# Patient Record
Sex: Male | Born: 1955 | Race: White | Hispanic: No | Marital: Married | Smoking: Former smoker
Health system: Southern US, Community
[De-identification: ages and names within clinical notes are randomized; demographics above are authoritative.]

## PROBLEM LIST (undated history)

## (undated) DIAGNOSIS — J939 Pneumothorax, unspecified: Secondary | ICD-10-CM

## (undated) DIAGNOSIS — G8929 Other chronic pain: Secondary | ICD-10-CM

## (undated) DIAGNOSIS — S2249XA Multiple fractures of ribs, unspecified side, initial encounter for closed fracture: Secondary | ICD-10-CM

## (undated) DIAGNOSIS — F32A Depression, unspecified: Secondary | ICD-10-CM

## (undated) DIAGNOSIS — J449 Chronic obstructive pulmonary disease, unspecified: Secondary | ICD-10-CM

## (undated) DIAGNOSIS — F329 Major depressive disorder, single episode, unspecified: Secondary | ICD-10-CM

## (undated) DIAGNOSIS — M542 Cervicalgia: Secondary | ICD-10-CM

## (undated) HISTORY — DX: Chronic obstructive pulmonary disease, unspecified: J44.9

## (undated) HISTORY — DX: Other chronic pain: G89.29

## (undated) HISTORY — DX: Depression, unspecified: F32.A

## (undated) HISTORY — DX: Multiple fractures of ribs, unspecified side, initial encounter for closed fracture: S22.49XA

## (undated) HISTORY — DX: Major depressive disorder, single episode, unspecified: F32.9

## (undated) HISTORY — PX: KNEE SURGERY: SHX244

## (undated) HISTORY — DX: Cervicalgia: M54.2

---

## 1997-10-17 ENCOUNTER — Ambulatory Visit (HOSPITAL_COMMUNITY): Admission: RE | Admit: 1997-10-17 | Discharge: 1997-10-17 | Payer: Self-pay | Admitting: Orthopedic Surgery

## 1999-03-11 ENCOUNTER — Emergency Department (HOSPITAL_COMMUNITY): Admission: EM | Admit: 1999-03-11 | Discharge: 1999-03-11 | Payer: Self-pay | Admitting: Emergency Medicine

## 2003-03-01 ENCOUNTER — Encounter: Payer: Self-pay | Admitting: Family Medicine

## 2003-03-01 ENCOUNTER — Encounter: Admission: RE | Admit: 2003-03-01 | Discharge: 2003-03-01 | Payer: Self-pay | Admitting: Family Medicine

## 2003-03-14 ENCOUNTER — Ambulatory Visit (HOSPITAL_COMMUNITY): Admission: RE | Admit: 2003-03-14 | Discharge: 2003-03-14 | Payer: Self-pay | Admitting: Gastroenterology

## 2003-03-14 LAB — HM COLONOSCOPY: HM Colonoscopy: NORMAL

## 2003-04-24 ENCOUNTER — Encounter: Admission: RE | Admit: 2003-04-24 | Discharge: 2003-04-24 | Payer: Self-pay | Admitting: Family Medicine

## 2004-03-13 ENCOUNTER — Encounter: Payer: Self-pay | Admitting: Internal Medicine

## 2004-06-19 ENCOUNTER — Emergency Department (HOSPITAL_COMMUNITY): Admission: EM | Admit: 2004-06-19 | Discharge: 2004-06-20 | Payer: Self-pay | Admitting: Emergency Medicine

## 2007-03-31 ENCOUNTER — Ambulatory Visit: Payer: Self-pay | Admitting: Internal Medicine

## 2007-03-31 DIAGNOSIS — M5030 Other cervical disc degeneration, unspecified cervical region: Secondary | ICD-10-CM

## 2007-03-31 DIAGNOSIS — Z9889 Other specified postprocedural states: Secondary | ICD-10-CM

## 2007-03-31 DIAGNOSIS — F17200 Nicotine dependence, unspecified, uncomplicated: Secondary | ICD-10-CM | POA: Insufficient documentation

## 2007-03-31 DIAGNOSIS — F329 Major depressive disorder, single episode, unspecified: Secondary | ICD-10-CM

## 2007-06-29 HISTORY — PX: HERNIA REPAIR: SHX51

## 2007-08-23 ENCOUNTER — Ambulatory Visit: Payer: Self-pay | Admitting: Internal Medicine

## 2007-08-23 DIAGNOSIS — N508 Other specified disorders of male genital organs: Secondary | ICD-10-CM | POA: Insufficient documentation

## 2007-08-31 ENCOUNTER — Ambulatory Visit: Payer: Self-pay | Admitting: Internal Medicine

## 2007-09-07 ENCOUNTER — Encounter (INDEPENDENT_AMBULATORY_CARE_PROVIDER_SITE_OTHER): Payer: Self-pay | Admitting: *Deleted

## 2007-09-29 ENCOUNTER — Encounter: Payer: Self-pay | Admitting: Internal Medicine

## 2007-10-10 ENCOUNTER — Encounter: Payer: Self-pay | Admitting: Internal Medicine

## 2007-10-10 ENCOUNTER — Ambulatory Visit (HOSPITAL_BASED_OUTPATIENT_CLINIC_OR_DEPARTMENT_OTHER): Admission: RE | Admit: 2007-10-10 | Discharge: 2007-10-10 | Payer: Self-pay | Admitting: Surgery

## 2007-10-31 ENCOUNTER — Encounter: Payer: Self-pay | Admitting: Internal Medicine

## 2008-01-08 ENCOUNTER — Telehealth (INDEPENDENT_AMBULATORY_CARE_PROVIDER_SITE_OTHER): Payer: Self-pay | Admitting: *Deleted

## 2008-02-01 ENCOUNTER — Telehealth (INDEPENDENT_AMBULATORY_CARE_PROVIDER_SITE_OTHER): Payer: Self-pay | Admitting: *Deleted

## 2008-02-09 ENCOUNTER — Telehealth (INDEPENDENT_AMBULATORY_CARE_PROVIDER_SITE_OTHER): Payer: Self-pay | Admitting: *Deleted

## 2008-03-26 ENCOUNTER — Ambulatory Visit: Payer: Self-pay | Admitting: Internal Medicine

## 2008-05-20 ENCOUNTER — Telehealth (INDEPENDENT_AMBULATORY_CARE_PROVIDER_SITE_OTHER): Payer: Self-pay | Admitting: *Deleted

## 2009-01-15 ENCOUNTER — Telehealth (INDEPENDENT_AMBULATORY_CARE_PROVIDER_SITE_OTHER): Payer: Self-pay | Admitting: *Deleted

## 2009-04-14 ENCOUNTER — Encounter (INDEPENDENT_AMBULATORY_CARE_PROVIDER_SITE_OTHER): Payer: Self-pay | Admitting: *Deleted

## 2009-04-14 ENCOUNTER — Telehealth (INDEPENDENT_AMBULATORY_CARE_PROVIDER_SITE_OTHER): Payer: Self-pay | Admitting: *Deleted

## 2009-09-01 ENCOUNTER — Telehealth (INDEPENDENT_AMBULATORY_CARE_PROVIDER_SITE_OTHER): Payer: Self-pay | Admitting: *Deleted

## 2009-10-01 ENCOUNTER — Ambulatory Visit: Payer: Self-pay | Admitting: Internal Medicine

## 2009-10-03 ENCOUNTER — Telehealth (INDEPENDENT_AMBULATORY_CARE_PROVIDER_SITE_OTHER): Payer: Self-pay | Admitting: *Deleted

## 2009-10-03 ENCOUNTER — Ambulatory Visit: Payer: Self-pay | Admitting: Internal Medicine

## 2009-10-08 LAB — CONVERTED CEMR LAB
ALT: 18 units/L (ref 0–53)
AST: 17 units/L (ref 0–37)
BUN: 11 mg/dL (ref 6–23)
Basophils Absolute: 0 10*3/uL (ref 0.0–0.1)
Basophils Relative: 0.3 % (ref 0.0–3.0)
CO2: 31 meq/L (ref 19–32)
Calcium: 9 mg/dL (ref 8.4–10.5)
Chloride: 105 meq/L (ref 96–112)
Cholesterol: 176 mg/dL (ref 0–200)
Creatinine, Ser: 0.8 mg/dL (ref 0.4–1.5)
Eosinophils Absolute: 0.1 10*3/uL (ref 0.0–0.7)
Eosinophils Relative: 0.8 % (ref 0.0–5.0)
GFR calc non Af Amer: 106.41 mL/min (ref >60)
Glucose, Bld: 92 mg/dL (ref 70–99)
HCT: 43.3 % (ref 39.0–52.0)
HDL: 58.3 mg/dL (ref >39.00)
Hemoglobin: 14.9 g/dL (ref 13.0–17.0)
LDL Cholesterol: 103 mg/dL — ABNORMAL HIGH (ref 0–99)
Lymphocytes Relative: 21.9 % (ref 12.0–46.0)
Lymphs Abs: 2 10*3/uL (ref 0.7–4.0)
MCHC: 34.4 g/dL (ref 30.0–36.0)
MCV: 90.7 fL (ref 78.0–100.0)
Monocytes Absolute: 0.6 10*3/uL (ref 0.1–1.0)
Monocytes Relative: 6.4 % (ref 3.0–12.0)
Neutro Abs: 6.4 10*3/uL (ref 1.4–7.7)
Neutrophils Relative %: 70.6 % (ref 43.0–77.0)
PSA: 0.97 ng/mL (ref 0.10–4.00)
Platelets: 214 10*3/uL (ref 150.0–400.0)
Potassium: 4.1 meq/L (ref 3.5–5.1)
RBC: 4.77 M/uL (ref 4.22–5.81)
RDW: 13.4 % (ref 11.5–14.6)
Sodium: 141 meq/L (ref 135–145)
TSH: 1.19 microintl units/mL (ref 0.35–5.50)
Total CHOL/HDL Ratio: 3
Triglycerides: 72 mg/dL (ref 0.0–149.0)
VLDL: 14.4 mg/dL (ref 0.0–40.0)
WBC: 9.1 10*3/uL (ref 4.5–10.5)

## 2010-04-08 ENCOUNTER — Encounter: Payer: Self-pay | Admitting: Internal Medicine

## 2010-04-08 LAB — HM COLONOSCOPY

## 2010-05-14 ENCOUNTER — Ambulatory Visit: Payer: Self-pay | Admitting: Family Medicine

## 2010-06-17 ENCOUNTER — Ambulatory Visit: Payer: Self-pay | Admitting: Family Medicine

## 2010-07-26 LAB — CONVERTED CEMR LAB
ALT: 19 units/L (ref 0–53)
AST: 19 units/L (ref 0–37)
BUN: 12 mg/dL (ref 6–23)
Basophils Absolute: 0 10*3/uL (ref 0.0–0.1)
Basophils Relative: 0.4 % (ref 0.0–1.0)
Bilirubin Urine: NEGATIVE
Blood in Urine, dipstick: NEGATIVE
CO2: 32 meq/L (ref 19–32)
Calcium: 9.5 mg/dL (ref 8.4–10.5)
Chloride: 104 meq/L (ref 96–112)
Cholesterol: 171 mg/dL (ref 0–200)
Creatinine, Ser: 0.9 mg/dL (ref 0.4–1.5)
Eosinophils Absolute: 0.1 10*3/uL (ref 0.0–0.6)
Eosinophils Relative: 0.9 % (ref 0.0–5.0)
GFR calc Af Amer: 114 mL/min
GFR calc non Af Amer: 94 mL/min
Glucose, Bld: 87 mg/dL (ref 70–99)
Glucose, Urine, Semiquant: NEGATIVE
HCT: 45 % (ref 39.0–52.0)
HDL: 58.4 mg/dL (ref >39.0)
Hemoglobin: 15.2 g/dL (ref 13.0–17.0)
Ketones, urine, test strip: NEGATIVE
LDL Cholesterol: 96 mg/dL (ref 0–99)
Lymphocytes Relative: 21.7 % (ref 12.0–46.0)
MCHC: 33.8 g/dL (ref 30.0–36.0)
MCV: 89.3 fL (ref 78.0–100.0)
Monocytes Absolute: 0.6 10*3/uL (ref 0.2–0.7)
Monocytes Relative: 6.8 % (ref 3.0–11.0)
Neutro Abs: 5.7 10*3/uL (ref 1.4–7.7)
Neutrophils Relative %: 70.2 % (ref 43.0–77.0)
Nitrite: NEGATIVE
PSA: 1.99 ng/mL (ref 0.10–4.00)
Platelets: 246 10*3/uL (ref 150–400)
Potassium: 4.3 meq/L (ref 3.5–5.1)
Protein, U semiquant: NEGATIVE
RBC: 5.04 M/uL (ref 4.22–5.81)
RDW: 12.2 % (ref 11.5–14.6)
Sodium: 141 meq/L (ref 135–145)
Specific Gravity, Urine: 1.01
TSH: 1.44 microintl units/mL (ref 0.35–5.50)
Total CHOL/HDL Ratio: 2.9
Triglycerides: 82 mg/dL (ref 0–149)
Urobilinogen, UA: NEGATIVE
VLDL: 16 mg/dL (ref 0–40)
WBC Urine, dipstick: NEGATIVE
WBC: 8.2 10*3/uL (ref 4.5–10.5)
pH: 7

## 2010-07-28 NOTE — Assessment & Plan Note (Signed)
Summary: cough.congested/cbs   Vital Signs:  Patient profile:   55 year old male Weight:      190 pounds BMI:     27.76 Temp:     98.2 degrees F oral Pulse rate:   70 / minute BP sitting:   114 / 60  (left arm)  Vitals Entered By: Doristine Devoid CMA (May 14, 2010 1:30 PM) CC: cough and congestion x1wk along w/ sinus drainage, Cough   History of Present Illness:  Cough      This is a 55 year old man who presents with Cough.  The symptoms began 1 week ago.  Pt here c/o cough for 5 days ---- everyone in house is sick.  The patient reports productive cough, shortness of breath, and wheezing, but denies non-productive cough, pleuritic chest pain, exertional dyspnea, fever, hemoptysis, and malaise.  The patient denies the following symptoms: cold/URI symptoms, sore throat, nasal congestion, chronic rhinitis, weight loss, acid reflux symptoms, and peripheral edema.  The cough is worse with activity and lying down.    Current Medications (verified): 1)  Effexor Xr 37.5 Mg  Cp24 (Venlafaxine Hcl) .Marland Kitchen.. 1 By Mouth Qd 2)  Augmentin 875-125 Mg Tabs (Amoxicillin-Pot Clavulanate) .Marland Kitchen.. 1 By Mouth Two Times A Day 3)  Mucinex Dm 30-600 Mg Xr12h-Tab (Dextromethorphan-Guaifenesin) 4)  Cheratussin Ac 100-10 Mg/9ml Syrp (Guaifenesin-Codeine) .Marland Kitchen.. 1-2 Tsp By Mouth At Bedtime  Allergies (verified): No Known Drug Allergies  Past History:  Past Medical History: Last updated: 03/31/2007 Depression  Past Surgical History: Last updated: 10-02-09 R knee arthroscopy R hernia repair 2009  Family History: Last updated: 2009/10/02 COPD-- Father ( deceased ) DM-- F Mother: living; skin Ca breast CA-- M, dx age 83 CAD: MGF? colon ca--no prostate ca--no  Social History: Last updated: 2009/10/02 Married 4 kids still smoking 1 ppd or more  ETOH-- rarely  diet-- healthy on-off  exercise-- always active , occasionally goes to the "[facility]", not running anymore   Risk Factors: Caffeine Use: 4  (October 02, 2009) Exercise: no (2009/10/02)  Risk Factors: Smoking Status: current (03/31/2007) Packs/Day: 1 (2009-10-02) Passive Smoke Exposure: no (03/31/2007)  Family History: Reviewed history from Oct 02, 2009 and no changes required. COPD-- Father ( deceased ) DM-- F Mother: living; skin Ca breast CA-- M, dx age 54 CAD: MGF? colon ca--no prostate ca--no  Social History: Reviewed history from 02-Oct-2009 and no changes required. Married 4 kids still smoking 1 ppd or more  ETOH-- rarely  diet-- healthy on-off  exercise-- always active , occasionally goes to the "[facility]", not running anymore   Review of Systems      See HPI  Physical Exam  General:  Well-developed,well-nourished,in no acute distress; alert,appropriate and cooperative throughout examination Ears:  External ear exam shows no significant lesions or deformities.  Otoscopic examination reveals clear canals, tympanic membranes are intact bilaterally without bulging, retraction, inflammation or discharge. Hearing is grossly normal bilaterally. Nose:  External nasal examination shows no deformity or inflammation. Nasal mucosa are pink and moist without lesions or exudates. Mouth:  Oral mucosa and oropharynx without lesions or exudates.  Teeth in good repair. Neck:  No deformities, masses, or tenderness noted. Lungs:  + rhonci scattered Heart:  normal rate and no murmur.   Psych:  Cognition and judgment appear intact. Alert and cooperative with normal attention span and concentration. No apparent delusions, illusions, hallucinations   Impression & Recommendations:  Problem # 1:  BRONCHITIS- ACUTE (ICD-466.0)  His updated medication list for this problem includes:    Augmentin  875-125 Mg Tabs (Amoxicillin-pot clavulanate) .Marland Kitchen... 1 by mouth two times a day    Mucinex Dm 30-600 Mg Xr12h-tab (Dextromethorphan-guaifenesin)    Cheratussin Ac 100-10 Mg/31ml Syrp (Guaifenesin-codeine) .Marland Kitchen... 1-2 tsp by mouth at bedtime  Take  antibiotics and other medications as directed. Encouraged to push clear liquids, get enough rest, and take acetaminophen as needed. To be seen in 5-7 days if no improvement, sooner if worse.  Complete Medication List: 1)  Effexor Xr 37.5 Mg Cp24 (Venlafaxine hcl) .Marland Kitchen.. 1 by mouth qd 2)  Augmentin 875-125 Mg Tabs (Amoxicillin-pot clavulanate) .Marland Kitchen.. 1 by mouth two times a day 3)  Mucinex Dm 30-600 Mg Xr12h-tab (Dextromethorphan-guaifenesin) 4)  Cheratussin Ac 100-10 Mg/79ml Syrp (Guaifenesin-codeine) .Marland Kitchen.. 1-2 tsp by mouth at bedtime  Patient Instructions: 1)  Acute Bronchitis symptoms for less then 10 days are not  helped by antibiotics. Take over the counter cough medications. Call if no improvement in 5-7 days, sooner if increasing cough, fever, or new symptoms ( shortness of breath, chest pain) .  Prescriptions: CHERATUSSIN AC 100-10 MG/5ML SYRP (GUAIFENESIN-CODEINE) 1-2 tsp by mouth at bedtime  #6oz x 0   Entered and Authorized by:   Loreen Freud DO   Signed by:   Loreen Freud DO on 05/14/2010   Method used:   Print then Give to Patient   RxID:   [id number] AUGMENTIN 875-125 MG TABS (AMOXICILLIN-POT CLAVULANATE) 1 by mouth two times a day  #20 x 0   Entered and Authorized by:   Loreen Freud DO   Signed by:   Loreen Freud DO on 05/14/2010   Method used:   Electronically to        [facility] [phone]* (retail)       [address]       [place], [place]  [address]       Ph: [phone]       Fax: [phone]   RxID:   [id number]    Orders Added: 1)  Est. Patient Level III [47425]

## 2010-07-28 NOTE — Assessment & Plan Note (Signed)
Summary: cpx/alr   Vital Signs:  Patient profile:   55 year old male Height:      69.5 inches Weight:      189 pounds BMI:     27.61 Pulse rate:   60 / minute BP sitting:   110 / 70  Vitals Entered By: Shary Decamp (October 01, 2009 3:12 PM) CC: cpx - not fasting Is Patient Diabetic? No   History of Present Illness: CPX  Preventive Screening-Counseling & Management  Alcohol-Tobacco     Alcohol type: beer, wine occasionally     Packs/Day: 1  Caffeine-Diet-Exercise     Caffeine use/day: 4     Does Patient Exercise: no     Times/week: 2  Current Medications (verified): 1)  Effexor Xr 37.5 Mg  Cp24 (Venlafaxine Hcl) .Marland Kitchen.. 1 By Mouth Qd  Allergies (verified): No Known Drug Allergies  Past History:  Past Medical History: Reviewed history from 03/31/2007 and no changes required. Depression  Past Surgical History: R knee arthroscopy R hernia repair 2009  Family History: COPD-- Father ( deceased ) DM-- F Mother: living; skin Ca breast CA-- M, dx age 16 CAD: MGF? colon ca--no prostate ca--no  Social History: Married 4 kids still smoking 1 ppd or more  ETOH-- rarely  diet-- healthy on-off  exercise-- always active , occasionally goes to the "[facility]", not running anymore  Packs/Day:  1 Does Patient Exercise:  no Caffeine use/day:  4  Review of Systems General:  Denies fatigue, fever, and weight loss. CV:  Denies chest pain or discomfort and swelling of feet. Resp:  Denies coughing up blood and wheezing; occasionally cough in AM w/some sputum . GI:  Denies bloody stools, diarrhea, nausea, and vomiting. GU:  Denies dysuria, hematuria, urinary frequency, and urinary hesitancy. MS:  occasionally has neck pain (x a while, will call if symptoms change or get worse).  Physical Exam  General:  alert and well-developed.   Neck:  no masses, no thyromegaly, normal carotid upstroke, and no cervical-supraclacicular  lymphadenopathy.   Lungs:  normal respiratory effort,  no intercostal retractions, no accessory muscle use, and normal breath sounds.   Heart:  normal rate, regular rhythm, no murmur, and no gallop.   Abdomen:  soft, non-tender, no distention, no masses, no guarding, and no rigidity.   Rectal:  No external abnormalities noted. Normal sphincter tone. No rectal masses or tenderness. hemocult neg  Prostate:  Prostate gland firm and smooth, no nodularity, tenderness, mass, asymmetry or induration. slightly  enlarged  Extremities:  no pretibial edema bilaterally  Psych:  Cognition and judgment appear intact. Alert and cooperative with normal attention span and concentration.     Impression & Recommendations:  Problem # 1:  HEALTH SCREENING (ICD-V70.0)  Td 6 had a Cscope in 2004 for BRBPR,  they found hemorrhoids and tics,  was recommended to repeat a colonoscopy at age 69 (Dr Ewing Schlein)--  refer to Dr Ewing Schlein again discussed diet-exercise labs   Orders: Gastroenterology Referral (GI)  Problem # 2:  DEPRESSION (ICD-311) well controlled  His updated medication list for this problem includes:    Effexor Xr 37.5 Mg Cp24 (Venlafaxine hcl) .Marland Kitchen... 1 by mouth qd  Problem # 3:  TOBACCO ABUSE (ICD-305.1) risks of tobacco discussed, rec to see dentist q 6 months, infro provided about quiting as well as [facility] programs     Complete Medication List: 1)  Effexor Xr 37.5 Mg Cp24 (Venlafaxine hcl) .Marland Kitchen.. 1 by mouth qd  Patient Instructions: 1)  came  back fasting: 2)  FLP AST ALT CBC BMP TSH PSA --- V70 3)  Please schedule a follow-up appointment in 1 year.    Risk Factors:  Tobacco use:  current    Cigarettes:  Yes -- 1 pack(s) per day Passive smoke exposure:  no Drug use:  no Caffeine use:  4 drinks per day Alcohol use:  yes    Type:  beer, wine occasionally Exercise:  no  Colonoscopy History:     Date of Last Colonoscopy:  03/14/2003    Results:  normal     Preventive Care Screening  Colonoscopy:    Date:  03/14/2003    Results:  normal     Prior Values:    PSA:  1.99 (08/31/2007)    Last Tetanus Booster:  Tdap (03/31/2007)

## 2010-07-28 NOTE — Procedures (Signed)
Summary: Colonoscopy--tics, 1 polyp, next  in 10 years  Colonoscopy / [facility]   Imported By: Lennie Odor 04/24/2010 12:12:28  _____________________________________________________________________  External Attachment:    Type:   Image     Comment:   External Document

## 2010-07-28 NOTE — Progress Notes (Signed)
Summary: refill  Phone Note Refill Request Message from:  Fax from Pharmacy on September 01, 2009 9:33 AM  Refills Requested: Medication #1:  EFFEXOR XR 37.5 MG  CP24 1 by mouth qd [facility] fax [phone]   Method Requested: Mail to Pharmacy Next Appointment Scheduled: no appt Initial call taken by: Barb Merino,  September 01, 2009 9:34 AM  Follow-up for Phone Call        spoke wth pt informed due for ov. Scheduled CPX for 10/01/09.  refilled for 30 days with no refills .Kandice Hams  September 01, 2009 10:01 AM  Follow-up by: Kandice Hams,  September 01, 2009 10:01 AM    Prescriptions: EFFEXOR XR 37.5 MG  CP24 (VENLAFAXINE HCL) 1 by mouth qd  #30 x 0   Entered by:   Kandice Hams   Authorized by:   Nolon Rod. Paz MD   Signed by:   Kandice Hams on 09/01/2009   Method used:   Faxed to ...       [facility]  [address] [phone]* (retail)       [address]       [place], [place]  [address]       Ph: [phone]       Fax: [phone]   RxID:   [id number]

## 2010-07-28 NOTE — Progress Notes (Signed)
Summary: NEEDS 90 PRESCRIPTION FOR EFFEXOR  Phone Note Call from Patient Call back at [phone] = CELL   Caller: Patient Summary of Call: PATIENT NEEDS 90 PRESCRIPTION PLUS REFILLS FOR EFFEXOR SENT TO [facility]--HE ALREADY HAS AN ACCOUNT WITH THEM Initial call taken by: Jerolyn Shin,  October 03, 2009 12:15 PM    Prescriptions: EFFEXOR XR 37.5 MG  CP24 (VENLAFAXINE HCL) 1 by mouth qd  #90 x 3   Entered by:   Shary Decamp   Authorized by:   Nolon Rod. Paz MD   Signed by:   Shary Decamp on 10/03/2009   Method used:   Faxed to ...       [facility] (mail-order)             , [place]         Ph:        Fax: [phone]   RxID:   [id number]

## 2010-07-28 NOTE — Miscellaneous (Signed)
Summary: colonoscopy   NAME:  Curtis [name], Curtis [name]                         ACCOUNT NO.:  [id number]   MEDICAL RECORD NO.:  [id number]                   PATIENT TYPE:  AMB   LOCATION:  ENDO                                 FACILITY:  [facility]   PHYSICIAN:  Petra Kuba, M.D.                 DATE OF BIRTH:  1955-10-31   DATE OF PROCEDURE:  03/14/2003  DATE OF DISCHARGE:                                 OPERATIVE REPORT   PROCEDURE:  Colonoscopy.   INDICATIONS FOR PROCEDURE:  Bright red blood per rectum in a patient almost  due for colonic screening.  Consent was signed after risks, benefits,  methods, and options were thoroughly discussed in the office.   MEDICINES USED:  Demerol 50, Versed 6.   DESCRIPTION OF PROCEDURE:  Rectal inspection was pertinent for small  external hemorrhoids. Digital exam was negative. The pediatric video  adjustable colonoscope was inserted, easily advanced around the colon to the  cecum. This did require some abdominal pressure no position changes. No  signs of bleeding was seen on insertion. The cecum was identified by the  appendiceal orifice and the ileocecal valve. In fact, the scope was inserted  a short ways into the terminal ileum which was normal. Photo documentation  was obtained. The scope was slowly withdrawn. The prep was adequate. There  was some liquid stool that required washing and suctioning. On slow  withdrawal through the colon other than some early occasional left sided  diverticula, no abnormalities were seen. Anal rectal pull through and  retroflexion confirmed some small hemorrhoids. The scope was straightened  and readvanced a short ways up the left side of the colon, air was  suctioned, scope removed. The patient tolerated the procedure well. There  was no obvious or immediate complications.   ENDOSCOPIC DIAGNOSIS:  1. Internal and external small hemorrhoids.  2. Left occasional early diverticula.  3. Otherwise within normal limits to  the terminal ileum.   PLAN:  Yearly rectals and guaiacs per Dr. Modesto Charon. Happy to see back p.r.n.  otherwise repeat screening at age 56.                                               Petra Kuba, M.D.    MEM/MEDQ  D:  03/14/2003  T:  03/15/2003  Job:  [id number]   cc:   Thelma Barge P. Modesto Charon, M.D.  [address]  [place]  [place] [address]  Fax: [phone]  Clinical Lists Changes

## 2010-07-28 NOTE — Progress Notes (Signed)
Summary: RX  Phone Note Refill Request Message from:  Pharmacy on [facility] [phone]  Refills Requested: Medication #1:  EFFEXOR XR 37.5 MG  CP24 1 by mouth qd Initial call taken by: Kandice Hams,  April 14, 2009 4:49 PM    Prescriptions: EFFEXOR XR 37.5 MG  CP24 (VENLAFAXINE HCL) 1 by mouth qd  #90 x 0   Entered by:   Kandice Hams   Authorized by:   Nolon Rod. Paz MD   Signed by:   Kandice Hams on 04/14/2009   Method used:   Faxed to ...       [facility] (mail-order)             , [place]         Ph:        Fax: [phone]   RxID:   [id number]

## 2010-07-30 NOTE — Assessment & Plan Note (Signed)
Summary: FOR COUGH//PH   Vital Signs:  Patient profile:   55 year old male Weight:      191 pounds BMI:     27.90 Temp:     98.3 degrees F oral BP sitting:   112 / 64  (left arm)  Vitals Entered By: Doristine Devoid CMA (June 17, 2010 11:44 AM) CC: cough and sinus congestion along w/ L ear pain    History of Present Illness: 55 yo Curtis [name] here today for cough and congestion.  woke up w/ L ear pain.  took Augmentin last month for something similar.  still w/ deep cough, nasal congestion, wheezing.  no fever.  no facial pain/pressure.  + sick contacts.  Current Medications (verified): 1)  Effexor Xr 37.5 Mg  Cp24 (Venlafaxine Hcl) .Marland Kitchen.. 1 By Mouth Qd 2)  Mucinex Dm 30-600 Mg Xr12h-Tab (Dextromethorphan-Guaifenesin)  Allergies (verified): No Known Drug Allergies  Review of Systems      See HPI  Physical Exam  General:  Well-developed,well-nourished,in no acute distress; alert,appropriate and cooperative throughout examination Head:  NCAT, no TTP over sinuses Eyes:  no injxn or inflammation Ears:  External ear exam shows no significant lesions or deformities.  Otoscopic examination reveals clear canals, tympanic membranes are intact bilaterally without bulging, retraction, inflammation or discharge. Hearing is grossly normal bilaterally. Nose:  + turbinate edema and congestion Mouth:  + PND otherwise normal Neck:  No deformities, masses, or tenderness noted. Lungs:  Normal respiratory effort, chest expands symmetrically. Lungs are clear to auscultation, no crackles or wheezes.  deep cough Heart:  normal rate and no murmur.     Impression & Recommendations:  Problem # 1:  BRONCHITIS- ACUTE (ICD-466.0) Assessment New given duration of cough will start abx.  if no improvement after this round of tx will need CXR.  likely an allergy component to cough and pt to start nasal steroid spray.  reviewed supportive care and red flags that should prompt return.  Pt expresses understanding  and is in agreement w/ this plan. The following medications were removed from the medication list:    Cheratussin Ac 100-10 Mg/64ml Syrp (Guaifenesin-codeine) .Marland Kitchen... 1-2 tsp by mouth at bedtime His updated medication list for this problem includes:    Mucinex Dm 30-600 Mg Xr12h-tab (Dextromethorphan-guaifenesin)    Azithromycin 250 Mg Tabs (Azithromycin) .Marland Kitchen... 2 by  mouth today and then 1 daily for 4 days    Cheratussin Ac 100-10 Mg/36ml Syrp (Guaifenesin-codeine) .Marland Kitchen... 1-2 tsps q4-6 as needed for cough.  disp  Complete Medication List: 1)  Effexor Xr 37.5 Mg Cp24 (Venlafaxine hcl) .Marland Kitchen.. 1 by mouth qd 2)  Mucinex Dm 30-600 Mg Xr12h-tab (Dextromethorphan-guaifenesin) 3)  Azithromycin 250 Mg Tabs (Azithromycin) .... 2 by  mouth today and then 1 daily for 4 days 4)  Fluticasone Propionate 50 Mcg/act Susp (Fluticasone propionate) .... 2 sprays each nostril once daily 5)  Cheratussin Ac 100-10 Mg/24ml Syrp (Guaifenesin-codeine) .Marland Kitchen.. 1-2 tsps q4-6 as needed for cough.  disp  Patient Instructions: 1)  Start the nasal spray daily to decrease nasal congestion 2)  Take the Azithromycin as directed for bronchitis- if the cough continues after this round of antibiotics we'll need to get a chest xray 3)  Drink plenty of fluids 4)  REST! 5)  Use the cough medicine at night and on weekends- will cause drowsiness 6)  Call with any questions or concerns 7)  Hang in there! 8)  Happy Holidays!! Prescriptions: CHERATUSSIN AC 100-10 MG/5ML SYRP (GUAIFENESIN-CODEINE) 1-2 tsps  Q4-6 as needed for cough.  disp  #163ml x 0   Entered and Authorized by:   Neena Rhymes MD   Signed by:   Neena Rhymes MD on 06/17/2010   Method used:   Print then Give to Patient   RxID:   [id number] FLUTICASONE PROPIONATE 50 MCG/ACT  SUSP (FLUTICASONE PROPIONATE) 2 sprays each nostril once daily  #1 x 3   Entered and Authorized by:   Neena Rhymes MD   Signed by:   Neena Rhymes MD on 06/17/2010    Method used:   Electronically to        [facility] [phone]* (retail)       [address]       [place], [place]  [address]       Ph: [phone]       Fax: [phone]   RxID:   [id number] AZITHROMYCIN 250 MG  TABS (AZITHROMYCIN) 2 by  mouth today and then 1 daily for 4 days  #6 x 0   Entered and Authorized by:   Neena Rhymes MD   Signed by:   Neena Rhymes MD on 06/17/2010   Method used:   Electronically to        [facility] [phone]* (retail)       [address]       [place], [place]  [address]       Ph: [phone]       Fax: [phone]   RxID:   [id number]    Orders Added: 1)  Est. Patient Level III [10626]

## 2010-08-12 ENCOUNTER — Telehealth (INDEPENDENT_AMBULATORY_CARE_PROVIDER_SITE_OTHER): Payer: Self-pay | Admitting: *Deleted

## 2010-08-19 NOTE — Progress Notes (Signed)
Summary: rx  Phone Note Refill Request Call back at [phone] Message from:  Patient on August 12, 2010 11:01 AM  Refills Requested: Medication #1:  EFFEXOR XR 37.5 MG  CP24 1 by mouth qd   Dosage confirmed as above?Dosage Confirmed   Supply Requested: 3 months sent to [facility]  Initial call taken by: Freddy Jaksch,  August 12, 2010 11:01 AM    Prescriptions: EFFEXOR XR 37.5 MG  CP24 (VENLAFAXINE HCL) 1 by mouth qd  #90 x 0   Entered by:   Army Fossa CMA   Authorized by:   Nolon Rod. Paz MD   Signed by:   Army Fossa CMA on 08/12/2010   Method used:   Electronically to        [facility] [phone]* (retail)       [address]       [place], [place]  [address]       Ph: [phone]       Fax: [phone]   RxID:   [id number]   Appended Document: rx    Prescriptions: EFFEXOR XR 37.5 MG  CP24 (VENLAFAXINE HCL) 1 by mouth qd  #90 x 0   Entered by:   Army Fossa CMA   Authorized by:   Nolon Rod. Paz MD   Signed by:   Army Fossa CMA on 08/12/2010   Method used:   Faxed to ...       [facility] (mail-order)             , [place]         Ph:        Fax: [phone]   RxID:   [id number]  was sent to wrong pharm. Army Fossa CMA  August 12, 2010 11:28 AM

## 2010-11-10 NOTE — Op Note (Signed)
NAMEDAMARKO, STITELY               ACCOUNT NO.:  [id number]   MEDICAL RECORD NO.:  [id number]          PATIENT TYPE:  AMB   LOCATION:  DSC                          FACILITY:  [facility]   PHYSICIAN:  Wilmon Arms. Corliss Skains, M.D. DATE OF BIRTH:  February 09, 1956   DATE OF PROCEDURE:  DATE OF DISCHARGE:                               OPERATIVE REPORT   PREOPERATIVE DIAGNOSIS:  Right inguinal hernia.   POSTOPERATIVE DIAGNOSIS:  Right inguinal hernia.   PROCEDURE PERFORMED:  Right inguinal hernia repair with mesh.   SURGEON:  Wilmon Arms. Corliss Skains, M.D., FACS   ANESTHESIA:  General endotracheal.   INDICATIONS:  The patient is a 55 year old male who presents with a 2-  month history of a bulge in his right groin.  This has been enlarging.  This is still reducible.  He presents for the right inguinal hernia  repair.   DESCRIPTION OF PROCEDURE:  The patient was brought to the operating room  and placed in the supine position on operating room table.  After an  adequate level of general anesthesia was obtained, the patient's right  groin was shaved, prepped with Betadine, and draped in sterile fashion.  A time-out was taken to assure proper patient, proper procedure.  The  area above his right inguinal ligament was infiltrated with 0.25%  Marcaine with epinephrine.  An incision was made above the inguinal  ligament.  Dissection was carried down to the fascia.  The external  oblique fascia was opened along the direction of its fibers down to the  external ring.  We bluntly dissected around the spermatic cord and  retracted this with a Penrose drain.  The floor of the inguinal canal  was laxed with large bulge.  We skeletonized the spermatic cord and  reduced a medium-sized indirect hernia sac.  The remainder of the cord  structure is felt normal.  The internal ring was tightened with a 2-0  Vicryl suture.  The floor of the inguinal canal was reapproximated with  a running 0 PDS suture.  Ultrapro mesh was cut  into a keyhole shape and  secured to begin at the pubic tubercle with 2-0 Vicryl sutures.  The  tails were tucked to the spermatic cord and sutured together and tucked  underneath the external oblique fascia laterally.  The fascia was then  reapproximated with 2-0 Vicryl.  The 3-0 Vicryl was used to close the  subcutaneous tissues and 4-0 Monocryl was used to close the skin.  Steri-  Strips and clean dressings were applied.  The patient was extubated and  brought to recovery room in stable condition.  All sponge, instrument,  and needle counts were correct.      Wilmon Arms. Tsuei, M.D.  Electronically Signed     MKT/MEDQ  D:  10/10/2007  T:  10/10/2007  Job:  [id number]   cc:   Willow Ora, MD

## 2010-11-11 ENCOUNTER — Other Ambulatory Visit: Payer: Self-pay | Admitting: Internal Medicine

## 2010-11-13 NOTE — Op Note (Signed)
   NAME:  Curtis [name], Curtis [name]                         ACCOUNT NO.:  [id number]   MEDICAL RECORD NO.:  [id number]                   PATIENT TYPE:  AMB   LOCATION:  ENDO                                 FACILITY:  [facility]   PHYSICIAN:  Petra Kuba, M.D.                 DATE OF BIRTH:  May 25, 1956   DATE OF PROCEDURE:  03/14/2003  DATE OF DISCHARGE:                                 OPERATIVE REPORT   PROCEDURE:  Colonoscopy.   INDICATIONS FOR PROCEDURE:  Bright red blood per rectum in a patient almost  due for colonic screening.  Consent was signed after risks, benefits,  methods, and options were thoroughly discussed in the office.   MEDICINES USED:  Demerol 50, Versed 6.   DESCRIPTION OF PROCEDURE:  Rectal inspection was pertinent for small  external hemorrhoids. Digital exam was negative. The pediatric video  adjustable colonoscope was inserted, easily advanced around the colon to the  cecum. This did require some abdominal pressure no position changes. No  signs of bleeding was seen on insertion. The cecum was identified by the  appendiceal orifice and the ileocecal valve. In fact, the scope was inserted  a short ways into the terminal ileum which was normal. Photo documentation  was obtained. The scope was slowly withdrawn. The prep was adequate. There  was some liquid stool that required washing and suctioning. On slow  withdrawal through the colon other than some early occasional left sided  diverticula, no abnormalities were seen. Anal rectal pull through and  retroflexion confirmed some small hemorrhoids. The scope was straightened  and readvanced a short ways up the left side of the colon, air was  suctioned, scope removed. The patient tolerated the procedure well. There  was no obvious or immediate complications.   ENDOSCOPIC DIAGNOSIS:  1. Internal and external small hemorrhoids.  2. Left occasional early diverticula.  3. Otherwise within normal limits to the terminal  ileum.   PLAN:  Yearly rectals and guaiacs per Dr. Modesto Charon. Happy to see back p.r.n.  otherwise repeat screening at age 14.                                               Petra Kuba, M.D.    MEM/MEDQ  D:  03/14/2003  T:  03/15/2003  Job:  [id number]   cc:   Thelma Barge P. Modesto Charon, M.D.  [address]  [place]  [place] [address]  Fax: [phone]

## 2011-02-16 ENCOUNTER — Telehealth: Payer: Self-pay | Admitting: *Deleted

## 2011-02-16 NOTE — Telephone Encounter (Signed)
Left message to call office   [facility] Triage Call Report Triage Record Num: [id number] Operator: Martie Lee Long Patient Name: Curtis [name] Call Date & Time: 02/13/2011 12:31:08PM Patient Phone: [phone] PCP: Nolon Rod. Paz Patient Gender: Male PCP Fax : Patient DOB: 1955/10/31 Practice Name: [facility] Reason for Call: Jeff/Patient calling about redness and tenderness from his rt index finger. Pt is also calling bc he has a haking cough with yellow sputum. Afebrile. Instructed to be seen by provider within 4 hrs. Protocol(s) Used: Abrasions, Lacerations, Puncture Wounds Recommended Outcome per Protocol: See Provider within 4 hours Reason for Outcome: Any signs and symptoms of worsening infection Care Advice: ~ SYMPTOM / CONDITION MANAGEMENT

## 2011-02-17 NOTE — Telephone Encounter (Signed)
Pt states that he went to UC for treatment and finger is doing better now.

## 2011-02-17 NOTE — Telephone Encounter (Signed)
Thanks

## 2011-03-19 ENCOUNTER — Ambulatory Visit (INDEPENDENT_AMBULATORY_CARE_PROVIDER_SITE_OTHER): Payer: 59 | Admitting: Internal Medicine

## 2011-03-19 ENCOUNTER — Encounter: Payer: Self-pay | Admitting: Internal Medicine

## 2011-03-19 ENCOUNTER — Ambulatory Visit (INDEPENDENT_AMBULATORY_CARE_PROVIDER_SITE_OTHER)
Admission: RE | Admit: 2011-03-19 | Discharge: 2011-03-19 | Disposition: A | Discharge status: Home / Self Care | Payer: 59 | Source: Ambulatory Visit | Attending: Internal Medicine | Admitting: Internal Medicine

## 2011-03-19 DIAGNOSIS — M5030 Other cervical disc degeneration, unspecified cervical region: Secondary | ICD-10-CM

## 2011-03-19 DIAGNOSIS — J4489 Other specified chronic obstructive pulmonary disease: Secondary | ICD-10-CM

## 2011-03-19 DIAGNOSIS — J449 Chronic obstructive pulmonary disease, unspecified: Secondary | ICD-10-CM

## 2011-03-19 DIAGNOSIS — F3289 Other specified depressive episodes: Secondary | ICD-10-CM

## 2011-03-19 DIAGNOSIS — F329 Major depressive disorder, single episode, unspecified: Secondary | ICD-10-CM

## 2011-03-19 DIAGNOSIS — F17200 Nicotine dependence, unspecified, uncomplicated: Secondary | ICD-10-CM

## 2011-03-19 MED ORDER — VENLAFAXINE HCL ER 37.5 MG PO CP24: 37.5000 mg | ORAL_CAPSULE | Freq: Every day | ORAL | Status: DC

## 2011-03-19 MED ORDER — VARENICLINE TARTRATE 0.5 MG X 11 & 1 MG X 42 PO MISC: ORAL | Status: DC

## 2011-03-19 NOTE — Assessment & Plan Note (Signed)
Agree w/ chantix, Rx provided, info about quitting also provided

## 2011-03-19 NOTE — Assessment & Plan Note (Signed)
RF effexor

## 2011-03-19 NOTE — Patient Instructions (Signed)
Get the x-ray done Start Chantix, if you like it, call for a refill  Please come back in 2 months for a complete physical exam

## 2011-03-19 NOTE — Progress Notes (Signed)
  Subjective:    Patient ID: Curtis [name], male    DOB: 16-Aug-1955, 55 y.o.   MRN: [id number]  HPI 5-10 years h/o neck pain, at the base of neck bilaterally, some rad to both trapezoid areas, worse lately Tobacco-- long h/o 1.5 ppd use, chantix? Depression-- well controlled on low effexor dose , needs a RF   Past Medical History  Diagnosis Date  . Depression    Past Surgical History  Procedure Date  . Knee surgery     arthroscopy  . Hernia repair 2009    right    Family History: COPD-- Father ( deceased ) DM-- F Mother: living; skin Ca breast CA-- M, dx age 83 CAD: MGF? colon ca--no prostate ca--no  Social History: Married 4 kids still smoking 1 ppd or more  ETOH-- rarely  diet-- healthy on-off  exercise-- always active , occasionally goes to the [place]", not running anymore   Review of Systems No bladder or bowel incontinence No LE or UE paresthesias No sob but admits to chronic cough, chest congestion, worse lately (d/t allergies?); has abx rx recently for a finger infection, they didn't clear the cough No hemoptysis     Objective:   Physical Exam  Constitutional: He is oriented to person, place, and time. He appears well-developed and well-nourished.  HENT:  Head: Normocephalic and atraumatic.  Neck: Normal range of motion. Neck supple.       No TTP at Cspine   Cardiovascular: Normal rate, regular rhythm and normal heart sounds.   No murmur heard. Pulmonary/Chest: Effort normal. He has no wheezes. He has no rales.       Decreased breath sounds otherwise normal  Musculoskeletal: He exhibits no edema.  Neurological: He is alert and oriented to person, place, and time. He displays normal reflexes. No cranial nerve deficit. He exhibits normal muscle tone. Coordination normal.  Psychiatric: He has a normal mood and affect. His behavior is normal. Judgment and thought content normal.         Assessment & Plan:

## 2011-03-19 NOTE — Assessment & Plan Note (Signed)
See HPI , needs further eval. Saw neurosurgery years ago, had a "scan"  Dx w/ DJD Plan: ortho referal

## 2011-03-19 NOTE — Assessment & Plan Note (Addendum)
Suspect COPD based on  chronic tobacco abuse and chronic cough. Spirometry is however normal, we'll check a chest x-ray

## 2011-03-23 LAB — CBC
HCT: 43.7
Hemoglobin: 14.9
MCHC: 34
MCV: 89.4
Platelets: 200
RBC: 4.89
RDW: 13
WBC: 7.1

## 2011-03-23 LAB — DIFFERENTIAL
Basophils Absolute: 0
Basophils Relative: 0
Eosinophils Absolute: 0.1
Eosinophils Relative: 1
Lymphocytes Relative: 18
Lymphs Abs: 1.2
Monocytes Absolute: 0.5
Monocytes Relative: 8
Neutro Abs: 5.2
Neutrophils Relative %: 73

## 2011-03-23 LAB — BASIC METABOLIC PANEL
BUN: 13
CO2: 27
Calcium: 9.2
Chloride: 104
Creatinine, Ser: 0.78
GFR calc Af Amer: >60
GFR calc non Af Amer: >60
Glucose, Bld: 99
Potassium: 4.5
Sodium: 136

## 2011-03-23 LAB — POCT HEMOGLOBIN-HEMACUE: Hemoglobin: 15.2

## 2011-05-19 ENCOUNTER — Encounter: Payer: 59 | Admitting: Internal Medicine

## 2011-05-31 ENCOUNTER — Ambulatory Visit (INDEPENDENT_AMBULATORY_CARE_PROVIDER_SITE_OTHER): Payer: 59 | Admitting: Internal Medicine

## 2011-05-31 ENCOUNTER — Encounter: Payer: Self-pay | Admitting: Internal Medicine

## 2011-05-31 DIAGNOSIS — Z0000 Encounter for general adult medical examination without abnormal findings: Secondary | ICD-10-CM | POA: Insufficient documentation

## 2011-05-31 DIAGNOSIS — Z23 Encounter for immunization: Secondary | ICD-10-CM

## 2011-05-31 LAB — CBC WITH DIFFERENTIAL/PLATELET
Basophils Absolute: 0 10*3/uL (ref 0.0–0.1)
Basophils Relative: 0.3 % (ref 0.0–3.0)
Eosinophils Absolute: 0.1 10*3/uL (ref 0.0–0.7)
Eosinophils Relative: 0.8 % (ref 0.0–5.0)
HCT: 45.4 % (ref 39.0–52.0)
Hemoglobin: 15.4 g/dL (ref 13.0–17.0)
Lymphocytes Relative: 15.6 % (ref 12.0–46.0)
Lymphs Abs: 1.5 10*3/uL (ref 0.7–4.0)
MCHC: 33.9 g/dL (ref 30.0–36.0)
MCV: 91.2 fl (ref 78.0–100.0)
Monocytes Absolute: 0.8 10*3/uL (ref 0.1–1.0)
Monocytes Relative: 8.1 % (ref 3.0–12.0)
Neutro Abs: 7.2 10*3/uL (ref 1.4–7.7)
Neutrophils Relative %: 75.2 % (ref 43.0–77.0)
Platelets: 215 10*3/uL (ref 150.0–400.0)
RBC: 4.98 Mil/uL (ref 4.22–5.81)
RDW: 13.3 % (ref 11.5–14.6)
WBC: 9.6 10*3/uL (ref 4.5–10.5)

## 2011-05-31 LAB — COMPREHENSIVE METABOLIC PANEL
ALT: 24 U/L (ref 0–53)
AST: 25 U/L (ref 0–37)
Albumin: 4.1 g/dL (ref 3.5–5.2)
Alkaline Phosphatase: 56 U/L (ref 39–117)
BUN: 25 mg/dL — ABNORMAL HIGH (ref 6–23)
CO2: 27 mEq/L (ref 19–32)
Calcium: 9.3 mg/dL (ref 8.4–10.5)
Chloride: 106 mEq/L (ref 96–112)
Creatinine, Ser: 0.8 mg/dL (ref 0.4–1.5)
GFR: 99.99 mL/min (ref >60.00)
Glucose, Bld: 88 mg/dL (ref 70–99)
Potassium: 4.8 mEq/L (ref 3.5–5.1)
Sodium: 141 mEq/L (ref 135–145)
Total Bilirubin: 0.9 mg/dL (ref 0.3–1.2)
Total Protein: 6.5 g/dL (ref 6.0–8.3)

## 2011-05-31 LAB — PSA: PSA: 2.18 ng/mL (ref 0.10–4.00)

## 2011-05-31 LAB — LIPID PANEL
Cholesterol: 182 mg/dL (ref 0–200)
HDL: 57.9 mg/dL (ref >39.00)
LDL Cholesterol: 110 mg/dL — ABNORMAL HIGH (ref 0–99)
Total CHOL/HDL Ratio: 3
Triglycerides: 70 mg/dL (ref 0.0–149.0)
VLDL: 14 mg/dL (ref 0.0–40.0)

## 2011-05-31 LAB — TSH: TSH: 1.28 u[IU]/mL (ref 0.35–5.50)

## 2011-05-31 NOTE — Assessment & Plan Note (Addendum)
Td 08 Had a flu shot Pneumonia shot today had a Cscope in 2004 for BRBPR,  they found hemorrhoids and tics Cscope again 03-2010, tics, next in 10 years ( Dr Ewing Schlein ) discussed diet-exercise labs  Tobacco: in no unclear terms told pt this is his biggest challenge, risk of CAD-Ca-death discussed Discussed chantix, free hospital programs, Dr Dellia Cloud

## 2011-05-31 NOTE — Progress Notes (Signed)
  Subjective:    Patient ID: Curtis [name], male    DOB: 1955-09-09, 55 y.o.   MRN: [id number]  HPI CPX Still smokes   Past Medical History: Depression Neck pain , chronic   Past Surgical History: R knee arthroscopy R hernia repair 2009  Family History: COPD-- Father ( deceased ) DM-- F Mother: living; skin Ca breast CA-- M, dx age 45 CAD: MGF? colon ca--no prostate ca--no  Social History: Married 4 kids still smoking 1 to 1.5  ppd   ETOH-- rarely  diet-- healthy on-off  exercise-- some exercise , less than previous years    Review of Systems  Respiratory: Negative for cough, shortness of breath and wheezing.   Cardiovascular: Negative for chest pain and leg swelling.  Gastrointestinal: Negative for abdominal pain and blood in stool.  Genitourinary: Negative for dysuria, hematuria and difficulty urinating.  Psychiatric/Behavioral:       Sx well controlled        Objective:   Physical Exam  Constitutional: He is oriented to person, place, and time. He appears well-developed and well-nourished. No distress.  HENT:  Head: Normocephalic and atraumatic.  Neck: No thyromegaly present.  Cardiovascular: Normal rate, regular rhythm and normal heart sounds.   No murmur heard. Pulmonary/Chest: Effort normal and breath sounds normal. No respiratory distress. He has no wheezes. He has no rales.  Abdominal: Soft. Bowel sounds are normal. He exhibits no distension. There is no tenderness. There is no rebound.  Genitourinary: Rectum normal and prostate normal.  Musculoskeletal: He exhibits no edema.  Neurological: He is alert and oriented to person, place, and time.  Skin: He is not diaphoretic.  Psychiatric: He has a normal mood and affect. His behavior is normal. Judgment and thought content normal.       Assessment & Plan:

## 2011-09-11 ENCOUNTER — Other Ambulatory Visit: Payer: Self-pay | Admitting: Internal Medicine

## 2011-09-12 ENCOUNTER — Other Ambulatory Visit: Payer: Self-pay | Admitting: Internal Medicine

## 2011-09-13 ENCOUNTER — Telehealth: Payer: Self-pay | Admitting: *Deleted

## 2011-09-13 MED ORDER — VENLAFAXINE HCL ER 37.5 MG PO CP24: 37.5000 mg | ORAL_CAPSULE | Freq: Every day | ORAL | Status: DC

## 2011-09-13 NOTE — Telephone Encounter (Signed)
Spoke with pt & insurance does not cover 90 day supply, had to resend prescription for 30 day.

## 2011-09-13 NOTE — Telephone Encounter (Signed)
Refill done.  

## 2012-06-02 ENCOUNTER — Telehealth: Payer: Self-pay

## 2012-06-02 NOTE — Telephone Encounter (Signed)
Error

## 2012-08-01 ENCOUNTER — Telehealth: Payer: Self-pay | Admitting: Internal Medicine

## 2012-08-01 NOTE — Telephone Encounter (Signed)
Patient states he lost his rx for effexor-xr. He thinks it fell out of his briefcase. He would like another refill.  Pt uses [facility] [address]

## 2012-08-02 MED ORDER — VENLAFAXINE HCL ER 37.5 MG PO CP24: 37.5000 mg | ORAL_CAPSULE | Freq: Every day | ORAL | Status: DC

## 2012-08-02 NOTE — Telephone Encounter (Signed)
Please call patient, set up a complete physical exam within 3 months. Call a three-month supply.

## 2012-08-02 NOTE — Telephone Encounter (Signed)
Refill done.  Pt scheduled appt 4.11.14

## 2012-08-02 NOTE — Telephone Encounter (Signed)
Pt has not been seen within a year. OK to refill? 

## 2012-10-06 ENCOUNTER — Encounter: Payer: 59 | Admitting: Internal Medicine

## 2012-10-26 ENCOUNTER — Encounter: Payer: Self-pay | Admitting: Internal Medicine

## 2012-10-26 ENCOUNTER — Ambulatory Visit (INDEPENDENT_AMBULATORY_CARE_PROVIDER_SITE_OTHER): Payer: 59 | Admitting: Internal Medicine

## 2012-10-26 VITALS — BP 118/78 | HR 71 | Temp 98.1°F | Ht 69.0 in | Wt 189.0 lb

## 2012-10-26 DIAGNOSIS — J449 Chronic obstructive pulmonary disease, unspecified: Secondary | ICD-10-CM

## 2012-10-26 DIAGNOSIS — M5030 Other cervical disc degeneration, unspecified cervical region: Secondary | ICD-10-CM

## 2012-10-26 DIAGNOSIS — Z0000 Encounter for general adult medical examination without abnormal findings: Secondary | ICD-10-CM

## 2012-10-26 LAB — LIPID PANEL
Cholesterol: 168 mg/dL (ref 0–200)
HDL: 56 mg/dL (ref >39.00)
LDL Cholesterol: 100 mg/dL — ABNORMAL HIGH (ref 0–99)
Total CHOL/HDL Ratio: 3
Triglycerides: 62 mg/dL (ref 0.0–149.0)
VLDL: 12.4 mg/dL (ref 0.0–40.0)

## 2012-10-26 LAB — COMPREHENSIVE METABOLIC PANEL
ALT: 16 U/L (ref 0–53)
AST: 18 U/L (ref 0–37)
Albumin: 4.1 g/dL (ref 3.5–5.2)
Alkaline Phosphatase: 58 U/L (ref 39–117)
BUN: 18 mg/dL (ref 6–23)
CO2: 28 mEq/L (ref 19–32)
Calcium: 9.4 mg/dL (ref 8.4–10.5)
Chloride: 105 mEq/L (ref 96–112)
Creatinine, Ser: 0.9 mg/dL (ref 0.4–1.5)
GFR: 96.84 mL/min (ref >60.00)
Glucose, Bld: 83 mg/dL (ref 70–99)
Potassium: 4.5 mEq/L (ref 3.5–5.1)
Sodium: 135 mEq/L (ref 135–145)
Total Bilirubin: 0.7 mg/dL (ref 0.3–1.2)
Total Protein: 6.4 g/dL (ref 6.0–8.3)

## 2012-10-26 LAB — CBC WITH DIFFERENTIAL/PLATELET
Basophils Absolute: 0 10*3/uL (ref 0.0–0.1)
Basophils Relative: 0.3 % (ref 0.0–3.0)
Eosinophils Absolute: 0.1 10*3/uL (ref 0.0–0.7)
Eosinophils Relative: 0.6 % (ref 0.0–5.0)
HCT: 45.9 % (ref 39.0–52.0)
Hemoglobin: 15.9 g/dL (ref 13.0–17.0)
Lymphocytes Relative: 14.2 % (ref 12.0–46.0)
Lymphs Abs: 1.4 10*3/uL (ref 0.7–4.0)
MCHC: 34.6 g/dL (ref 30.0–36.0)
MCV: 88.3 fl (ref 78.0–100.0)
Monocytes Absolute: 0.8 10*3/uL (ref 0.1–1.0)
Monocytes Relative: 7.7 % (ref 3.0–12.0)
Neutro Abs: 7.7 10*3/uL (ref 1.4–7.7)
Neutrophils Relative %: 77.2 % — ABNORMAL HIGH (ref 43.0–77.0)
Platelets: 225 10*3/uL (ref 150.0–400.0)
RBC: 5.2 Mil/uL (ref 4.22–5.81)
RDW: 13.9 % (ref 11.5–14.6)
WBC: 10 10*3/uL (ref 4.5–10.5)

## 2012-10-26 LAB — TSH: TSH: 1.97 u[IU]/mL (ref 0.35–5.50)

## 2012-10-26 LAB — PSA: PSA: 2.94 ng/mL (ref 0.10–4.00)

## 2012-10-26 NOTE — Progress Notes (Signed)
  Subjective:    Patient ID: Curtis [name], male    DOB: 1956/02/17, 57 y.o.   MRN: [id number]  HPI CPX  Past Medical History  Diagnosis Date  . Depression   . Neck pain, chronic    Past Surgical History  Procedure Laterality Date  . Knee surgery Right 1990s    arthroscopy  . Hernia repair  2009    right   Family History  Problem Relation Age of Onset  . Cancer Mother     skin  . COPD Father   . Diabetes Father   . Colon cancer Neg Hx   . Breast cancer Mother 23  . Prostate cancer Neg Hx   . CAD Neg Hx     MGF?  Marland Kitchen Stroke Father 74   History   Social History  . Marital Status: Married    Spouse Name: 4    Number of Children: N/A  . 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 Smoking status: Current Every Day Smoker -- 1.50 packs/day for 40 years    Types: Cigarettes  . Smokeless tobacco: Never Used     Comment: 1.5 ppd  . Alcohol Use: Yes     Comment: socially   . Drug Use: No  . Sexually Active: Not on file   Other Topics Concern  . Not on file   Social History Narrative   Diet - "could be better", needs to eat more fruits   Exercise- not very active           Review of Systems No fever or chills, no unexplained weight changes. No nausea, vomiting, diarrhea or blood in the stools. No dysuria or gross hematuria. Continue smoking, usually has one cough spell in the morning most days  w/ production of green sputum, no hemoptysis. No shortness of breath or wheezing.     Objective:   Physical Exam BP 118/78  Pulse 71  Temp(Src) 98.1 F (36.7 C) (Oral)  Ht 5\' 9"  (1.753 m)  Wt 189 lb (85.73 kg)  BMI 27.9 kg/m2  SpO2 98%  General -- alert, well-developed, No apparent distress   Neck --no thyromegaly , normal carotid pulse, no LADs Lungs -- normal respiratory effort, no intercostal retractions, no accessory muscle use, and normal breath sounds.   Heart-- normal rate, regular rhythm, no  murmur, and no gallop.   Abdomen--soft, non-tender, no distention, no masses, no HSM, no guarding, and no rigidity.   Extremities-- no pretibial edema bilaterally Rectal-- No external abnormalities noted. Normal sphincter tone. No rectal masses or tenderness. Brown stool  Prostate:  Prostate gland firm and smooth, no enlargement, nodularity, tenderness, mass; Asymmetry? Larger on the left?Marland Kitchen No tenderness or nodularity noted. Psych-- Cognition and judgment appear intact. Alert and cooperative with normal attention span and concentration.  not anxious appearing and not depressed appearing.         Assessment & Plan:

## 2012-10-26 NOTE — Assessment & Plan Note (Signed)
Ongoing tobacco abuse, only sx is daily cough early in the morning. Last chest x-ray 02-2011. Counseled  about tobacco. PFTs

## 2012-10-26 NOTE — Assessment & Plan Note (Addendum)
Saw  Dr Regino Schultze ~ 2012, not candidate for local injection was rec other modalities, tried PT, saw a chiropractor . Currently pain is on-off, essentially the same. Rarely takes mobic

## 2012-10-26 NOTE — Assessment & Plan Note (Addendum)
Td 08 Pneumonia shot 2012 Zostavax discussed EKG sinus bradycardia, no change from previous had a Cscope in 2004 for BRBPR,  they found hemorrhoids and tics Cscope again 03-2010, tics, next in 10 years ( Dr Ewing Schlein ) Asymmetric prostate,Refer to urology discussed diet-exercise labs  Tobacco: risk of CAD-Ca-death discussed Discussed cessation, info provided rec to see dentist q 6 months, chantix?  RTC 6 months to reassess tobacco

## 2012-10-30 ENCOUNTER — Encounter: Payer: Self-pay | Admitting: *Deleted

## 2012-11-22 ENCOUNTER — Other Ambulatory Visit: Payer: Self-pay | Admitting: *Deleted

## 2012-11-22 MED ORDER — VENLAFAXINE HCL ER 37.5 MG PO CP24: 37.5000 mg | ORAL_CAPSULE | Freq: Every day | ORAL | Status: DC

## 2012-11-22 NOTE — Telephone Encounter (Signed)
Rx sent 

## 2012-11-28 ENCOUNTER — Ambulatory Visit (INDEPENDENT_AMBULATORY_CARE_PROVIDER_SITE_OTHER): Payer: 59 | Admitting: Internal Medicine

## 2012-11-28 DIAGNOSIS — J449 Chronic obstructive pulmonary disease, unspecified: Secondary | ICD-10-CM

## 2012-11-28 LAB — PULMONARY FUNCTION TEST

## 2012-11-28 NOTE — Progress Notes (Signed)
PFT done today. 

## 2012-12-04 ENCOUNTER — Encounter: Payer: Self-pay | Admitting: Family Medicine

## 2012-12-04 ENCOUNTER — Ambulatory Visit (INDEPENDENT_AMBULATORY_CARE_PROVIDER_SITE_OTHER): Payer: 59 | Admitting: Family Medicine

## 2012-12-04 VITALS — BP 108/64 | HR 78 | Temp 99.2°F | Wt 185.8 lb

## 2012-12-04 DIAGNOSIS — S8010XA Contusion of unspecified lower leg, initial encounter: Secondary | ICD-10-CM

## 2012-12-04 DIAGNOSIS — S8012XA Contusion of left lower leg, initial encounter: Secondary | ICD-10-CM | POA: Insufficient documentation

## 2012-12-04 NOTE — Assessment & Plan Note (Signed)
Cont compression, ice [facility] tomorrow

## 2012-12-04 NOTE — Progress Notes (Signed)
  Subjective:    Patient ID: Curtis [name], male    DOB: Jun 26, 1956, 57 y.o.   MRN: [id number]  HPI Pt here c/o pain in L thigh / leg that started several weeks ago when he was playing in yard with dog and he felt a pop in L hip/ low back---then a few days ago he slipped and fell in shower with same leg and he now has ecchymoisis and errythema behind L knee with no pain at rest--- he only has pain with hyperextension.     Review of Systems As above    Objective:   Physical Exam BP 108/64  Pulse 78  Temp(Src) 99.2 F (37.3 C) (Oral)  Wt 185 lb 12.8 oz (84.278 kg)  BMI 27.43 kg/m2  SpO2 97% General appearance: alert, cooperative, appears stated age and no distress Extremities: extremities normal, atraumatic, no cyanosis or edema Neurologic: Gait: Normal               Normal strength low ext               L leg-- + ecchymosis and errythema behind L knee                              No calf pain       Assessment & Plan:

## 2012-12-09 ENCOUNTER — Telehealth: Payer: Self-pay | Admitting: Internal Medicine

## 2012-12-09 NOTE — Telephone Encounter (Signed)
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 Best treatment is to stop tobacco, if we can help in any way to let us know.

## 2012-12-11 ENCOUNTER — Encounter: Payer: 59 | Admitting: Internal Medicine

## 2012-12-11 ENCOUNTER — Encounter: Payer: Self-pay | Admitting: *Deleted

## 2012-12-11 NOTE — Telephone Encounter (Signed)
Mailed letter °

## 2013-02-23 ENCOUNTER — Other Ambulatory Visit: Payer: Self-pay | Admitting: Internal Medicine

## 2013-02-23 NOTE — Telephone Encounter (Signed)
rx refilled per protocol. DJR  

## 2013-04-29 ENCOUNTER — Other Ambulatory Visit: Payer: Self-pay | Admitting: Internal Medicine

## 2013-04-30 NOTE — Telephone Encounter (Signed)
Velafaxine refill sent to pharmacy

## 2013-05-02 ENCOUNTER — Ambulatory Visit (INDEPENDENT_AMBULATORY_CARE_PROVIDER_SITE_OTHER): Payer: 59 | Admitting: Internal Medicine

## 2013-05-02 ENCOUNTER — Encounter: Payer: Self-pay | Admitting: Internal Medicine

## 2013-05-02 VITALS — BP 133/79 | HR 60 | Temp 98.3°F

## 2013-05-02 DIAGNOSIS — J449 Chronic obstructive pulmonary disease, unspecified: Secondary | ICD-10-CM

## 2013-05-02 DIAGNOSIS — N400 Enlarged prostate without lower urinary tract symptoms: Secondary | ICD-10-CM

## 2013-05-02 DIAGNOSIS — Z23 Encounter for immunization: Secondary | ICD-10-CM

## 2013-05-02 NOTE — Progress Notes (Signed)
  Subjective:    Patient ID: Curtis [name], male    DOB: 03-Feb-1956, 57 y.o.   MRN: [id number]  HPI Routine office visit Saw urology, note reviewed, see assessment and plan. Continue smoking, using the e- cigarette, has decrease to some extent the amount of cigarettes he uses; had PFTs done, see assessment and plan.  Past Medical History  Diagnosis Date  . Depression   . Neck pain, chronic    Past Surgical History  Procedure Laterality Date  . Knee surgery Right 1990s    arthroscopy  . Hernia repair  2009    right   History   Social History  . Marital Status: Married    Spouse Name: N/A    Number of Children: 4  . 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 Smoking status: Current Every Day Smoker -- 1.50 packs/day for 40 years    Types: Cigarettes  . Smokeless tobacco: Never Used     Comment: 1ppd + e cigarret  . Alcohol Use: Yes     Comment: socially   . Drug Use: No  . Sexual Activity: Not on file   Other Topics Concern  . Not on file   Social History Narrative   Lives w/ wife   4 children , one at home              Review of Systems Diet-- regular to healthy Exercise -- little  Continue with cough in AM, small amount of whitish sputum production. Denies wheezing or hemoptysis.    Objective:   Physical Exam BP 133/79  Pulse 60  Temp(Src) 98.3 F (36.8 C)  SpO2 99% General -- alert, well-developed, NAD.  Lungs -- normal respiratory effort, no intercostal retractions, no accessory muscle use, and normal breath sounds.  Heart-- normal rate, regular rhythm, no murmur.  Neurologic--  alert & oriented X3. Speech normal, gait normal, strength normal in all extremities.    Psych-- Cognition and judgment appear intact. Cooperative with normal attention span and concentration. No anxious appearing , no depressed appearing.      Assessment & Plan:

## 2013-05-02 NOTE — Patient Instructions (Signed)
  Next visit in 6 months for a physical exam . Fasting Please make an appointment

## 2013-05-02 NOTE — Assessment & Plan Note (Signed)
PFTs 11/2012-- showed mild COPD. Today we again discussed tobacco abuse, risks including CAD and cancer. Asked him to think about Chantix and Wellbutrin. usin the e- cigarette and  has decreased to some extent his tobacco use. Reassess in 6 months

## 2013-10-23 ENCOUNTER — Other Ambulatory Visit: Payer: Self-pay | Admitting: Internal Medicine

## 2013-11-02 ENCOUNTER — Ambulatory Visit (INDEPENDENT_AMBULATORY_CARE_PROVIDER_SITE_OTHER): Payer: 59 | Admitting: Physician Assistant

## 2013-11-02 ENCOUNTER — Encounter: Payer: Self-pay | Admitting: Physician Assistant

## 2013-11-02 VITALS — BP 102/70 | HR 66 | Temp 98.4°F | Resp 16 | Ht 69.0 in | Wt 187.2 lb

## 2013-11-02 DIAGNOSIS — J209 Acute bronchitis, unspecified: Secondary | ICD-10-CM

## 2013-11-02 MED ORDER — AZITHROMYCIN 250 MG PO TABS: ORAL_TABLET | ORAL | Status: DC

## 2013-11-02 NOTE — Patient Instructions (Signed)
Take antibiotic as prescribed.  Increase fluid intake.  Rest.  Use saline nasal spray.  Take Mucinex for congestion.  Delsym for cough.  Place a humidifier in bedroom.  Acute Bronchitis Bronchitis is inflammation of the airways that extend from the windpipe into the lungs (bronchi). The inflammation often causes mucus to develop. This leads to a cough, which is the most common symptom of bronchitis.  In acute bronchitis, the condition usually develops suddenly and goes away over time, usually in a couple weeks. Smoking, allergies, and asthma can make bronchitis worse. Repeated episodes of bronchitis may cause further lung problems.  CAUSES Acute bronchitis is most often caused by the same virus that causes a cold. The virus can spread from person to person (contagious).  SIGNS AND SYMPTOMS   Cough.   Fever.   Coughing up mucus.   Body aches.   Chest congestion.   Chills.   Shortness of breath.   Sore throat.  DIAGNOSIS  Acute bronchitis is usually diagnosed through a physical exam. Tests, such as chest X-rays, are sometimes done to rule out other conditions.  TREATMENT  Acute bronchitis usually goes away in a couple weeks. Often times, no medical treatment is necessary. Medicines are sometimes given for relief of fever or cough. Antibiotics are usually not needed but may be prescribed in certain situations. In some cases, an inhaler may be recommended to help reduce shortness of breath and control the cough. A cool mist vaporizer may also be used to help thin bronchial secretions and make it easier to clear the chest.  HOME CARE INSTRUCTIONS  Get plenty of rest.   Drink enough fluids to keep your urine clear or pale yellow (unless you have a medical condition that requires fluid restriction). Increasing fluids may help thin your secretions and will prevent dehydration.   Only take over-the-counter or prescription medicines as directed by your health care provider.    Avoid smoking and secondhand smoke. Exposure to cigarette smoke or irritating chemicals will make bronchitis worse. If you are a smoker, consider using nicotine gum or skin patches to help control withdrawal symptoms. Quitting smoking will help your lungs heal faster.   Reduce the chances of another bout of acute bronchitis by washing your hands frequently, avoiding people with cold symptoms, and trying not to touch your hands to your mouth, nose, or eyes.   Follow up with your health care provider as directed.  SEEK MEDICAL CARE IF: Your symptoms do not improve after 1 week of treatment.  SEEK IMMEDIATE MEDICAL CARE IF:  You develop an increased fever or chills.   You have chest pain.   You have severe shortness of breath.  You have bloody sputum.   You develop dehydration.  You develop fainting.  You develop repeated vomiting.  You develop a severe headache. MAKE SURE YOU:   Understand these instructions.  Will watch your condition.  Will get help right away if you are not doing well or get worse. Document Released: 07/22/2004 Document Revised: 02/14/2013 Document Reviewed: 12/05/2012 [facility] Patient Information 2014 [place].

## 2013-11-02 NOTE — Progress Notes (Signed)
Pre visit review using our clinic review tool, if applicable. No additional management support is needed unless otherwise documented below in the visit note/SLS  

## 2013-11-02 NOTE — Progress Notes (Signed)
Patient presents to clinic today c/o chest congestion, sinus pressure,  fatigue, PND and worsening productive cough x 1.5 weeks.  Patient denies fever, pleuritic chest pain or SOB.  Has documented hx of mild COPD.  Patient has albuterol inhaler at home but has not had to use it.  Denies recent travel or sick contact.  Past Medical History  Diagnosis Date  . Depression   . Neck pain, chronic     Current Outpatient Prescriptions on File Prior to Visit  Medication Sig Dispense Refill  . venlafaxine XR (EFFEXOR-XR) 37.5 MG 24 hr capsule Take one capsule daily. DUE for physical @ DR. PAZ please schedule [phone]  30 capsule  0   No current facility-administered medications on file prior to visit.    No Known Allergies  Family History  Problem Relation Age of Onset  . Cancer Mother     skin  . COPD Father   . Diabetes Father   . Colon cancer Neg Hx   . Breast cancer Mother 89  . Prostate cancer Neg Hx   . CAD Neg Hx     MGF?  Marland Kitchen Stroke Father 84    History   Social History  . Marital Status: Married    Spouse Name: N/A    Number of Children: 4  . 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 Smoking status: Current Every Day Smoker -- 1.50 packs/day for 40 years    Types: Cigarettes  . Smokeless tobacco: Never Used     Comment: 1ppd + e cigarret  . Alcohol Use: Yes     Comment: socially   . Drug Use: No  . Sexual Activity: None   Other Topics Concern  . None   Social History Narrative   Lives w/ wife   4 children , one at home             Review of Systems - See HPI.  All other ROS are negative.  BP 102/70  Pulse 66  Temp(Src) 98.4 F (36.9 C) (Oral)  Resp 16  Ht 5\' 9"  (1.753 m)  Wt 187 lb 4 oz (84.936 kg)  BMI 27.64 kg/m2  SpO2 97%  Physical Exam  Vitals reviewed. Constitutional: He is oriented to person, place, and time and well-developed, well-nourished, and in no distress.  HENT:   Head: Normocephalic and atraumatic.  Right Ear: External ear normal.  Left Ear: External ear normal.  Nose: Nose normal.  Mouth/Throat: Oropharynx is clear and moist. No oropharyngeal exudate.  TM within normal limits bilaterally. No TTP of sinuses noted on exam.  Eyes: Conjunctivae are normal. Pupils are equal, round, and reactive to light.  Neck: Neck supple.  Cardiovascular: Normal rate, regular rhythm, normal heart sounds and intact distal pulses.   Pulmonary/Chest: Effort normal and breath sounds normal. No respiratory distress. He has no wheezes. He has no rales. He exhibits no tenderness.  Lymphadenopathy:    He has no cervical adenopathy.  Neurological: He is alert and oriented to person, place, and time.  Skin: Skin is warm and dry. No rash noted.  Psychiatric: Affect normal.   Assessment/Plan: Acute bronchitis Rx Azithromycin.  Increase fluid intake.  Rest.  Saline nasal spray.  Continue daily probiotic.  Plain Mucinex.  Delsym for cough.  Humidifier in bedroom.  Call or return to clinic if symptoms are not improving.

## 2013-11-03 ENCOUNTER — Telehealth: Payer: Self-pay | Admitting: Internal Medicine

## 2013-11-03 NOTE — Telephone Encounter (Signed)
Relevant patient education assigned to patient using Emmi. ° °

## 2013-11-05 DIAGNOSIS — J209 Acute bronchitis, unspecified: Secondary | ICD-10-CM | POA: Insufficient documentation

## 2013-11-05 NOTE — Assessment & Plan Note (Signed)
Rx Azithromycin.  Increase fluid intake.  Rest.  Saline nasal spray.  Continue daily probiotic.  Plain Mucinex.  Delsym for cough.  Humidifier in bedroom.  Call or return to clinic if symptoms are not improving.

## 2013-11-20 ENCOUNTER — Other Ambulatory Visit: Payer: Self-pay | Admitting: Internal Medicine

## 2013-12-18 ENCOUNTER — Encounter: Payer: Self-pay | Admitting: Internal Medicine

## 2013-12-18 ENCOUNTER — Ambulatory Visit (INDEPENDENT_AMBULATORY_CARE_PROVIDER_SITE_OTHER): Payer: 59 | Admitting: Internal Medicine

## 2013-12-18 ENCOUNTER — Other Ambulatory Visit: Payer: Self-pay | Admitting: Internal Medicine

## 2013-12-18 VITALS — BP 128/79 | HR 72 | Temp 98.4°F | Resp 16 | Wt 187.1 lb

## 2013-12-18 DIAGNOSIS — D179 Benign lipomatous neoplasm, unspecified: Secondary | ICD-10-CM

## 2013-12-18 DIAGNOSIS — J438 Other emphysema: Secondary | ICD-10-CM

## 2013-12-18 NOTE — Patient Instructions (Signed)
Please Schedule a fasting physical exam at your earliest convenience   If you need more information about quit tobacco visit  the American [employer] Association, it  is a great resource online at:  [email or web address]-flynn.net/

## 2013-12-18 NOTE — Progress Notes (Signed)
Subjective:    Patient ID: Curtis [name], male    DOB: May 28, 1956, 58 y.o.   MRN: [id number]  DOS:  12/18/2013 Type of  Visit: acute History:  Several months ago noted a lump at forehead, No pain, redness or discharge, no history of any injury. Patient's wife is concerned. Also, history of COPD, continue smoking.  ROS Admits to cough most days, mostly in the morning with clear or brownish sputum. Denies wheezing or hemoptysis  Past Medical History  Diagnosis Date  . Depression   . Neck pain, chronic   . COPD (chronic obstructive pulmonary disease)     Past Surgical History  Procedure Laterality Date  . Knee surgery Right 1990s    arthroscopy  . Hernia repair  2009    right    History   Social History  . Marital Status: Married    Spouse Name: N/A    Number of Children: 4  . 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 Smoking status: Current Every Day Smoker -- 1.50 packs/day for 40 years    Types: Cigarettes  . Smokeless tobacco: Never Used     Comment: 1ppd + e cigarret  . Alcohol Use: Yes     Comment: socially   . Drug Use: No  . Sexual Activity: Not on file   Other Topics Concern  . Not on file   Social History Narrative   Lives w/ wife   4 children , one at home                  Medication List       This list is accurate as of: 12/18/13  5:34 PM.  Always use your most recent med list.               CALCIUM-VITAMIN D3 PO  Take by mouth daily.     Fish Oil 1000 MG Caps  Take by mouth daily.     multivitamin tablet  Take 1 tablet by mouth daily.     PROBIOTIC DAILY Caps  Take by mouth daily.     venlafaxine XR 37.5 MG 24 hr capsule  Commonly known as:  EFFEXOR-XR  TAKE 1 CAPSULE BY MOUTH EVERY DAY     vitamin B-12 1000 MCG tablet  Commonly known as:  CYANOCOBALAMIN  Take 1,000 mcg by mouth daily.           Objective:   Physical Exam  HENT:  Head:     BP  128/79  Pulse 72  Temp(Src) 98.4 F (36.9 C) (Oral)  Resp 16  Wt 187 lb 2 oz (84.879 kg)  SpO2 95%  General -- alert, well-developed, NAD.   Lungs -- normal respiratory effort, no intercostal retractions, no accessory muscle use, and normal breath soundsExcept for scattered rhonchi.  Heart-- normal rate, regular rhythm, no murmur.   Extremities-- no pretibial edema bilaterally  Neurologic--  alert & oriented X3. Speech normal, gait appropriate for age, strength symmetric and appropriate for age.  Psych-- Cognition and judgment appear intact. Cooperative with normal attention span and concentration. No anxious or depressed appearing.     Assessment & Plan:    Lipoma, Forehead findings consistent with a lipoma, the only way to be 100% sure   is by removing it. Option was discussed with the patient he is not ready to go that route yet. We talked about observation, will  call if the area changes, increasing in size or gets red-swollen.  COPD, we discussed quit tobacco again. See instructions

## 2013-12-18 NOTE — Progress Notes (Signed)
Pre visit review using our clinic review tool, if applicable. No additional management support is needed unless otherwise documented below in the visit note. 

## 2014-03-22 ENCOUNTER — Ambulatory Visit (INDEPENDENT_AMBULATORY_CARE_PROVIDER_SITE_OTHER): Payer: 59 | Admitting: Internal Medicine

## 2014-03-22 ENCOUNTER — Ambulatory Visit (HOSPITAL_BASED_OUTPATIENT_CLINIC_OR_DEPARTMENT_OTHER)
Admission: RE | Admit: 2014-03-22 | Discharge: 2014-03-22 | Disposition: A | Discharge status: Home / Self Care | Payer: 59 | Source: Ambulatory Visit | Attending: Internal Medicine | Admitting: Internal Medicine

## 2014-03-22 ENCOUNTER — Encounter: Payer: Self-pay | Admitting: Internal Medicine

## 2014-03-22 VITALS — BP 120/62 | HR 69 | Temp 97.9°F | Ht 69.0 in | Wt 187.1 lb

## 2014-03-22 DIAGNOSIS — N400 Enlarged prostate without lower urinary tract symptoms: Secondary | ICD-10-CM

## 2014-03-22 DIAGNOSIS — Z23 Encounter for immunization: Secondary | ICD-10-CM

## 2014-03-22 DIAGNOSIS — R05 Cough: Secondary | ICD-10-CM | POA: Insufficient documentation

## 2014-03-22 DIAGNOSIS — R059 Cough, unspecified: Secondary | ICD-10-CM | POA: Insufficient documentation

## 2014-03-22 DIAGNOSIS — J410 Simple chronic bronchitis: Secondary | ICD-10-CM

## 2014-03-22 DIAGNOSIS — Z0000 Encounter for general adult medical examination without abnormal findings: Secondary | ICD-10-CM

## 2014-03-22 MED ORDER — VENLAFAXINE HCL ER 37.5 MG PO CP24: ORAL_CAPSULE | ORAL | Status: DC

## 2014-03-22 MED ORDER — VARENICLINE TARTRATE 0.5 MG X 11 & 1 MG X 42 PO MISC: ORAL | Status: DC

## 2014-03-22 NOTE — Progress Notes (Signed)
Subjective:    Patient ID: Curtis [name], male    DOB: Jul 09, 1955, 58 y.o.   MRN: [id number]  DOS:  03/22/2014 Type of visit - description : cpx Interval history: doing well , chantix?  ROS Forehead lipoma unchanged  No  CP, SOB No palpitations  Denies  nausea, vomiting diarrhea, blood in the stools + AM  cough, + sputum clear to brownn Mild occasional wheezing  (-)hemoptysis  No dysuria, gross hematuria, difficulty urinating  No anxiety, depression-- on effexor      Past Medical History  Diagnosis Date  . Depression   . Neck pain, chronic   . COPD (chronic obstructive pulmonary disease)     Past Surgical History  Procedure Laterality Date  . Knee surgery Right 1990s    arthroscopy  . Hernia repair  2009    right    History   Social History  . Marital Status: Married    Spouse Name: N/A    Number of Children: 4  . 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 Smoking status: Current Every Day Smoker -- 1.50 packs/day for 40 years    Types: Cigarettes  . Smokeless tobacco: Never Used     Comment: 1ppd + occ e cigarret  . Alcohol Use: Yes     Comment: socially   . Drug Use: No  . Sexual Activity: Not on file   Other Topics Concern  . Not on file   Social History Narrative   Lives w/ wife   4 children , one at home               Family History  Problem Relation Age of Onset  . Cancer Mother     skin  . COPD Father   . Diabetes Father   . Colon cancer Neg Hx   . Breast cancer Mother 19  . Prostate cancer Neg Hx   . CAD Neg Hx     MGF?  Marland Kitchen Stroke Father 76       Medication List       This list is accurate as of: 03/22/14  2:58 PM.  Always use your most recent med list.               CALCIUM-VITAMIN D3 PO  Take by mouth daily.     Fish Oil 1000 MG Caps  Take by mouth daily.     multivitamin tablet  Take 1 tablet by mouth daily.     PROBIOTIC DAILY Caps  Take by mouth  daily.     varenicline 0.5 MG X 11 & 1 MG X 42 tablet  Commonly known as:  CHANTIX STARTING MONTH PAK  Take one 0.5 mg tablet by mouth once daily for 3 days, then increase to one 0.5 mg tablet twice daily for 4 days, then increase to one 1 mg tablet twice daily.     venlafaxine XR 37.5 MG 24 hr capsule  Commonly known as:  EFFEXOR-XR  TAKE 1 CAPSULE BY MOUTH EVERY DAY     vitamin B-12 1000 MCG tablet  Commonly known as:  CYANOCOBALAMIN  Take 1,000 mcg by mouth daily.           Objective:   Physical Exam BP 120/62  Pulse 69  Temp(Src) 97.9 F (36.6 C) (Oral)  Ht 5\' 9"  (1.753 m)  Wt 187 lb 2 oz (84.879 kg)  BMI 27.62  kg/m2  SpO2 98% General -- alert, well-developed, NAD.  Neck --no thyromegaly , normal carotid pulse, no LAD HEENT-- Not pale.   Lungs -- normal respiratory effort, no intercostal retractions, no accessory muscle use, and normal breath sounds.  Heart-- normal rate, regular rhythm, no murmur.  Abdomen-- Not distended, good bowel sounds,soft, non-tender. No bruit  Rectal-- No external abnormalities noted. Normal sphincter tone. No rectal masses or tenderness. Stool brown  Prostate--Prostate gland firm and smooth, no enlargement, nodularity, tenderness, mass, minimal asymmetry ? (larger on the L?), no induration. Extremities-- no pretibial edema bilaterally  Neurologic--  alert & oriented X3. Speech normal, gait appropriate for age, strength symmetric and appropriate for age.    Psych-- Cognition and judgment appear intact. Cooperative with normal attention span and concentration. No anxious or depressed appearing.     Assessment & Plan:

## 2014-03-22 NOTE — Assessment & Plan Note (Addendum)
Still smoking, see  Comments under CPX Chronic cough, check a chest x-ray

## 2014-03-22 NOTE — Patient Instructions (Signed)
Get your blood work before you leave   Stop by the first floor and get the XR    Please come back to the office 6 months  for a routine check up  No  fasting    Stop by the front desk and schedule the visit

## 2014-03-22 NOTE — Progress Notes (Signed)
Pre visit review using our clinic review tool, if applicable. No additional management support is needed unless otherwise documented below in the visit note. 

## 2014-03-22 NOTE — Assessment & Plan Note (Signed)
See comments under physical exam

## 2014-03-22 NOTE — Assessment & Plan Note (Addendum)
Td 08 Pneumonia shot 2012 Zostavax discussed, declined for now  Flu shot today had a Cscope in 2004 for BRBPR,  they found hemorrhoids and tics Cscope again 03-2010, tics, next in 10 years ( Dr Watt Climes ) Last year prostate was felt to be asymmetric , saw  Urology, after a examination they felt he has BPH and rec routine prostate cancer screening . DRE today unchanged, will check a PSA discussed diet-exercise labs  Tobacco: risk of CAD-Ca-death discussed Discussed cessation--Chantix 1 Rx  provided, he'll start when ready and let me know for a prescription  additional medicine rec to see dentist q 6 months  RTC 6 months

## 2014-03-25 LAB — COMPREHENSIVE METABOLIC PANEL
ALK PHOS: 56 U/L (ref 39–117)
ALT: 13 U/L (ref 0–53)
AST: 16 U/L (ref 0–37)
Albumin: 4.3 g/dL (ref 3.5–5.2)
BUN: 14 mg/dL (ref 6–23)
CALCIUM: 9.4 mg/dL (ref 8.4–10.5)
CHLORIDE: 104 meq/L (ref 96–112)
CO2: 27 mEq/L (ref 19–32)
CREATININE: 0.87 mg/dL (ref 0.50–1.35)
Glucose, Bld: 87 mg/dL (ref 70–99)
Potassium: 4.5 mEq/L (ref 3.5–5.3)
Sodium: 136 mEq/L (ref 135–145)
Total Bilirubin: 0.5 mg/dL (ref 0.2–1.2)
Total Protein: 6.2 g/dL (ref 6.0–8.3)

## 2014-03-25 LAB — LIPID PANEL
CHOL/HDL RATIO: 3.1 ratio
Cholesterol: 170 mg/dL (ref 0–200)
HDL: 55 mg/dL (ref >39)
LDL CALC: 104 mg/dL — AB (ref 0–99)
Triglycerides: 57 mg/dL (ref <150)
VLDL: 11 mg/dL (ref 0–40)

## 2014-03-25 NOTE — Addendum Note (Signed)
Addended by: Peggyann Shoals on: 03/25/2014 03:15 PM   Modules accepted: Orders

## 2014-03-26 LAB — PSA: PSA: 2.5 ng/mL (ref <=4.00)

## 2014-09-23 ENCOUNTER — Encounter: Payer: Self-pay | Admitting: Internal Medicine

## 2014-09-23 ENCOUNTER — Ambulatory Visit (INDEPENDENT_AMBULATORY_CARE_PROVIDER_SITE_OTHER): Payer: 59 | Admitting: Internal Medicine

## 2014-09-23 VITALS — BP 124/72 | HR 67 | Temp 98.1°F | Ht 69.0 in | Wt 180.4 lb

## 2014-09-23 DIAGNOSIS — N400 Enlarged prostate without lower urinary tract symptoms: Secondary | ICD-10-CM | POA: Diagnosis not present

## 2014-09-23 DIAGNOSIS — J410 Simple chronic bronchitis: Secondary | ICD-10-CM | POA: Diagnosis not present

## 2014-09-23 DIAGNOSIS — F32A Depression, unspecified: Secondary | ICD-10-CM

## 2014-09-23 DIAGNOSIS — Z720 Tobacco use: Secondary | ICD-10-CM | POA: Diagnosis not present

## 2014-09-23 DIAGNOSIS — F17200 Nicotine dependence, unspecified, uncomplicated: Secondary | ICD-10-CM

## 2014-09-23 DIAGNOSIS — F329 Major depressive disorder, single episode, unspecified: Secondary | ICD-10-CM | POA: Diagnosis not present

## 2014-09-23 NOTE — Progress Notes (Signed)
   Subjective:    Patient ID: Curtis [name], male    DOB: September 07, 1955, 59 y.o.   MRN: [id number]  DOS:  09/23/2014 Type of visit - description : rov Interval history: History of COPD, last time we had extensive discussion about tobacco abuse, he has Chantix but did not fill the prescription On Effexor, symptoms well-controlled.    Review of Systems Denies chest pain, difficulty breathing, doe, wheezing. No nausea, vomiting, diarrhea.  Past Medical History  Diagnosis Date  . Depression   . Neck pain, chronic   . COPD (chronic obstructive pulmonary disease)     Past Surgical History  Procedure Laterality Date  . Knee surgery Right 1990s    arthroscopy  . Hernia repair  2009    right    History   Social History  . Marital Status: Married    Spouse Name: N/A  . Number of Children: 4  . 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 Smoking status: Current Every Day Smoker -- 1.50 packs/day for 40 years    Types: Cigarettes  . Smokeless tobacco: Never Used     Comment: 1ppd + occ e cigarret  . Alcohol Use: Yes     Comment: socially   . Drug Use: No  . Sexual Activity: Not on file   Other Topics Concern  . Not on file   Social History Narrative   Lives w/ wife   4 children , one at home                  Medication List       This list is accurate as of: 09/23/14  9:58 PM.  Always use your most recent med list.               CALCIUM-VITAMIN D3 PO  Take by mouth daily.     Fish Oil 1000 MG Caps  Take by mouth daily.     multivitamin tablet  Take 1 tablet by mouth daily.     PROBIOTIC DAILY Caps  Take by mouth daily.     venlafaxine XR 37.5 MG 24 hr capsule  Commonly known as:  EFFEXOR-XR  TAKE 1 CAPSULE BY MOUTH EVERY DAY     vitamin B-12 1000 MCG tablet  Commonly known as:  CYANOCOBALAMIN  Take 1,000 mcg by mouth daily.           Objective:   Physical Exam BP 124/72 mmHg   Pulse 67  Temp(Src) 98.1 F (36.7 C) (Oral)  Ht 5\' 9"  (1.753 m)  Wt 180 lb 6 oz (81.818 kg)  BMI 26.62 kg/m2  SpO2 97% General:   Well developed, well nourished . NAD.  HEENT:  Normocephalic . Face symmetric, atraumatic Lungs:  CTA B Normal respiratory effort, no intercostal retractions, no accessory muscle use. Heart: RRR,  no murmur.  Skin: Not pale. Not jaundice Neurologic:  alert & oriented X3.  Speech normal, gait appropriate for age and unassisted Psych--  Cognition and judgment appear intact.  Cooperative with normal attention span and concentration.  Behavior appropriate. No anxious or depressed appearing.       Assessment & Plan:

## 2014-09-23 NOTE — Assessment & Plan Note (Addendum)
Well-controlled with a low dose of Effexor, no change

## 2014-09-23 NOTE — Assessment & Plan Note (Signed)
Asymptomatic at this point

## 2014-09-23 NOTE — Assessment & Plan Note (Signed)
Carries the  diagnosis of COPD, currently asymptomatic, still smoking and today clearly let me know that he is not ready to quit. Will  continue discussing the issuewhen he comes back

## 2014-09-23 NOTE — Assessment & Plan Note (Addendum)
Was prescribed chantix, did not fill the prescription, not ready to quit. Counseled --- recommend to see a dentist regularly

## 2014-09-23 NOTE — Patient Instructions (Signed)
   Come back to the office by 02-2015  for a physical exam  Please schedule an appointment at the front desk    Come back fasting

## 2014-09-23 NOTE — Progress Notes (Signed)
Pre visit review using our clinic review tool, if applicable. No additional management support is needed unless otherwise documented below in the visit note. 

## 2015-03-28 ENCOUNTER — Encounter: Payer: Self-pay | Admitting: Internal Medicine

## 2015-03-28 ENCOUNTER — Ambulatory Visit (INDEPENDENT_AMBULATORY_CARE_PROVIDER_SITE_OTHER): Payer: 59 | Admitting: Internal Medicine

## 2015-03-28 VITALS — BP 116/64 | HR 64 | Temp 98.1°F | Ht 69.0 in | Wt 175.2 lb

## 2015-03-28 DIAGNOSIS — Z1159 Encounter for screening for other viral diseases: Secondary | ICD-10-CM

## 2015-03-28 DIAGNOSIS — F17200 Nicotine dependence, unspecified, uncomplicated: Secondary | ICD-10-CM

## 2015-03-28 DIAGNOSIS — Z23 Encounter for immunization: Secondary | ICD-10-CM | POA: Diagnosis not present

## 2015-03-28 DIAGNOSIS — Z114 Encounter for screening for human immunodeficiency virus [HIV]: Secondary | ICD-10-CM

## 2015-03-28 DIAGNOSIS — Z0000 Encounter for general adult medical examination without abnormal findings: Secondary | ICD-10-CM | POA: Diagnosis not present

## 2015-03-28 DIAGNOSIS — Z09 Encounter for follow-up examination after completed treatment for conditions other than malignant neoplasm: Secondary | ICD-10-CM | POA: Insufficient documentation

## 2015-03-28 NOTE — Progress Notes (Signed)
Subjective:    Patient ID: Curtis [name], male    DOB: 04/15/1956, 59 y.o.   MRN: [id number]  DOS:  03/28/2015 Type of visit - description : CPX  Interval history: No new concerns    Review of Systems Constitutional: No fever. No chills. No unexplained wt changes. No unusual sweats  HEENT: No dental problems, no ear discharge, no facial swelling, no voice changes. No eye discharge, no eye  redness , no  intolerance to light   Respiratory: No wheezing , no  difficulty breathing. + Cough in the mornings, some sputum production, no hemoptysis  Cardiovascular: No CP, no leg swelling , no  Palpitations  GI: no nausea, no vomiting, no diarrhea , no  abdominal pain.  No blood in the stools. No dysphagia, no odynophagia    Endocrine: No polyphagia, no polyuria , no polydipsia  GU: No dysuria, gross hematuria, difficulty urinating. No urinary urgency, no frequency.  Musculoskeletal: No joint swellings or unusual aches or pains  Skin: No change in the color of the skin, palor , no  Rash  Allergic, immunologic: No environmental allergies , no  food allergies  Neurological: No dizziness no  syncope. No headaches. No diplopia, no slurred, no slurred speech, no motor deficits, no facial  Numbness  Hematological: No enlarged lymph nodes, no easy bruising , no unusual bleedings  Psychiatry: No suicidal ideas, no hallucinations, no beavior problems, no confusion.  No unusual/severe anxiety, no depression   Past Medical History  Diagnosis Date  . Depression   . Neck pain, chronic   . COPD (chronic obstructive pulmonary disease)     Past Surgical History  Procedure Laterality Date  . Knee surgery Right 1990s    arthroscopy  . Hernia repair  2009    right    Social History   Social History  . Marital Status: Married    Spouse Name: N/A  . Number of Children: 4  . 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  Smoking status: Current Every Day Smoker -- 1.50 packs/day for 40 years    Types: Cigarettes  . Smokeless tobacco: Never Used     Comment: 1ppd + occ e cigarret  . Alcohol Use: Yes     Comment: socially   . Drug Use: No  . Sexual Activity: Not on file   Other Topics Concern  . Not on file   Social History Narrative   Lives w/ wife   4 children , one at home               Family History  Problem Relation Age of Onset  . Cancer Mother     skin  . COPD Father   . Diabetes Father   . Colon cancer Neg Hx   . Breast cancer Mother 75  . Prostate cancer Neg Hx   . CAD Neg Hx     MGF?  Marland Kitchen Stroke Father 42      Medication List       This list is accurate as of: 03/28/15  6:15 PM.  Always use your most recent med list.               CALCIUM-VITAMIN D3 PO  Take by mouth daily.     Fish Oil 1000 MG Caps  Take by mouth daily.     multivitamin tablet  Take 1 tablet by mouth daily.  PROBIOTIC DAILY Caps  Take by mouth daily.     venlafaxine XR 37.5 MG 24 hr capsule  Commonly known as:  EFFEXOR-XR  TAKE 1 CAPSULE BY MOUTH EVERY DAY     vitamin B-12 1000 MCG tablet  Commonly known as:  CYANOCOBALAMIN  Take 1,000 mcg by mouth daily.           Objective:   Physical Exam BP 116/64 mmHg  Pulse 64  Temp(Src) 98.1 F (36.7 C) (Oral)  Ht 5\' 9"  (1.753 m)  Wt 175 lb 4 oz (79.493 kg)  BMI 25.87 kg/m2  SpO2 96% General:   Well developed, well nourished . NAD.  HEENT:  Normocephalic . Face symmetric, atraumatic Neck: No thyromegaly, LADs or mass Lungs:  CTA B Normal respiratory effort, no intercostal retractions, no accessory muscle use. Heart: RRR,  no murmur.  no pretibial edema bilaterally  Abdomen:  Not distended, soft, non-tender. No rebound or rigidity. No mass,organomegaly Skin: Not pale. Not jaundice Neurologic:  alert & oriented X3.  Speech normal, gait appropriate for age and unassisted Psych--  Cognition and judgment appear intact.    Cooperative with normal attention span and concentration.  Behavior appropriate. No anxious or depressed appearing.    Assessment & Plan:   Assessment > Depression - on effexor COPD, smoker. PFTs 11-2012 mild COPD Chronic neck pain --- saw Dr Mina Marble 2012 BPH  Plan: Depression: Controlled COPD: Essentially asymptomatic except morning cough. Immunizations provided today Neck pain:  Not an issue today

## 2015-03-28 NOTE — Patient Instructions (Signed)
  Please schedule labs to be done within few days (fasting)    Next visit  for a routine checkup in 8 months   (15 minutes) Please schedule an appointment at the front desk

## 2015-03-28 NOTE — Assessment & Plan Note (Signed)
Depression: Controlled COPD: Essentially asymptomatic except morning cough. Immunizations provided today Neck pain:  Not an issue today

## 2015-03-28 NOTE — Progress Notes (Signed)
Pre visit review using our clinic review tool, if applicable. No additional management support is needed unless otherwise documented below in the visit note. 

## 2015-03-28 NOTE — Assessment & Plan Note (Signed)
Td 08 ; Pneumonia shot 2012; prevnar: today;  Flu shot today Zostavax discussed before had a Cscope in 2004 for BRBPR,  they found hemorrhoids and tics Cscope again 03-2010, tics, next in 10 years ( Dr Watt Climes ) Prostate cancer screening: DRE 2015 --->asymmetric prostate , saw  Urology, after a examination they felt he has BPH and rec routine prostate cancer screening . DRE 2015 stable (asymmetric) and PSA stable Reassess prostate 2017 discussed diet-exercise labs : CMP, FLP, CBC, hep C, HIV tobacco risks and cessation discussed. He does see the dentist regularly. Lung cancer screening discussed---- WILL schedule RTC 8 months

## 2015-04-02 ENCOUNTER — Other Ambulatory Visit: Payer: 59

## 2015-04-02 ENCOUNTER — Other Ambulatory Visit: Payer: Self-pay | Admitting: Internal Medicine

## 2015-10-20 ENCOUNTER — Other Ambulatory Visit (INDEPENDENT_AMBULATORY_CARE_PROVIDER_SITE_OTHER): Payer: 59

## 2015-10-20 DIAGNOSIS — Z0000 Encounter for general adult medical examination without abnormal findings: Secondary | ICD-10-CM | POA: Diagnosis not present

## 2015-10-20 DIAGNOSIS — Z114 Encounter for screening for human immunodeficiency virus [HIV]: Secondary | ICD-10-CM

## 2015-10-20 DIAGNOSIS — Z1159 Encounter for screening for other viral diseases: Secondary | ICD-10-CM

## 2015-10-20 LAB — COMPREHENSIVE METABOLIC PANEL
ALK PHOS: 50 U/L (ref 39–117)
ALT: 13 U/L (ref 0–53)
AST: 17 U/L (ref 0–37)
Albumin: 4.2 g/dL (ref 3.5–5.2)
BUN: 17 mg/dL (ref 6–23)
CALCIUM: 9.5 mg/dL (ref 8.4–10.5)
CO2: 29 mEq/L (ref 19–32)
Chloride: 106 mEq/L (ref 96–112)
Creatinine, Ser: 0.84 mg/dL (ref 0.40–1.50)
GFR: 98.51 mL/min (ref >60.00)
Glucose, Bld: 97 mg/dL (ref 70–99)
Potassium: 5 mEq/L (ref 3.5–5.1)
SODIUM: 140 meq/L (ref 135–145)
TOTAL PROTEIN: 6.6 g/dL (ref 6.0–8.3)
Total Bilirubin: 0.6 mg/dL (ref 0.2–1.2)

## 2015-10-20 LAB — HEPATITIS C ANTIBODY: HCV AB: NEGATIVE

## 2015-10-20 LAB — LIPID PANEL
CHOL/HDL RATIO: 3
Cholesterol: 180 mg/dL (ref 0–200)
HDL: 68.5 mg/dL (ref >39.00)
LDL CALC: 101 mg/dL — AB (ref 0–99)
NonHDL: 111.26
TRIGLYCERIDES: 51 mg/dL (ref 0.0–149.0)
VLDL: 10.2 mg/dL (ref 0.0–40.0)

## 2015-10-20 LAB — CBC WITH DIFFERENTIAL/PLATELET
BASOS ABS: 0 10*3/uL (ref 0.0–0.1)
Basophils Relative: 0.3 % (ref 0.0–3.0)
EOS ABS: 0.1 10*3/uL (ref 0.0–0.7)
Eosinophils Relative: 0.9 % (ref 0.0–5.0)
HCT: 45.9 % (ref 39.0–52.0)
Hemoglobin: 15.3 g/dL (ref 13.0–17.0)
LYMPHS ABS: 1.6 10*3/uL (ref 0.7–4.0)
Lymphocytes Relative: 17.9 % (ref 12.0–46.0)
MCHC: 33.3 g/dL (ref 30.0–36.0)
MCV: 89.3 fl (ref 78.0–100.0)
Monocytes Absolute: 0.7 10*3/uL (ref 0.1–1.0)
Monocytes Relative: 7.6 % (ref 3.0–12.0)
NEUTROS ABS: 6.6 10*3/uL (ref 1.4–7.7)
Neutrophils Relative %: 73.3 % (ref 43.0–77.0)
Platelets: 220 10*3/uL (ref 150.0–400.0)
RBC: 5.14 Mil/uL (ref 4.22–5.81)
RDW: 14 % (ref 11.5–15.5)
WBC: 9 10*3/uL (ref 4.0–10.5)

## 2015-10-20 LAB — HIV ANTIBODY (ROUTINE TESTING W REFLEX): HIV 1&2 Ab, 4th Generation: NONREACTIVE

## 2015-10-21 ENCOUNTER — Encounter: Payer: Self-pay | Admitting: *Deleted

## 2015-11-25 ENCOUNTER — Ambulatory Visit: Payer: 59 | Admitting: Internal Medicine

## 2015-11-26 ENCOUNTER — Ambulatory Visit: Payer: 59 | Admitting: Internal Medicine

## 2015-12-04 ENCOUNTER — Telehealth: Payer: Self-pay | Admitting: Internal Medicine

## 2015-12-04 ENCOUNTER — Ambulatory Visit: Payer: 59 | Admitting: Internal Medicine

## 2015-12-05 NOTE — Telephone Encounter (Signed)
Pt was no show 12/04/15 for follow up appt, pt has not rescheduled, 1st no show w/in 12 months, charge or no charge?

## 2015-12-05 NOTE — Telephone Encounter (Signed)
No Charge 

## 2015-12-08 ENCOUNTER — Encounter: Payer: Self-pay | Admitting: Internal Medicine

## 2015-12-08 NOTE — Telephone Encounter (Signed)
Waiving fee and mailing reminder letter

## 2016-01-28 ENCOUNTER — Ambulatory Visit: Payer: 59 | Admitting: Internal Medicine

## 2016-01-28 ENCOUNTER — Telehealth: Payer: Self-pay | Admitting: Internal Medicine

## 2016-01-28 DIAGNOSIS — Z0289 Encounter for other administrative examinations: Secondary | ICD-10-CM

## 2016-01-29 ENCOUNTER — Encounter: Payer: Self-pay | Admitting: Internal Medicine

## 2016-01-29 NOTE — Telephone Encounter (Signed)
Charge. 

## 2016-01-29 NOTE — Telephone Encounter (Signed)
Pt was no show 01/28/16 for f/u appt, pt has not rescheduled, 2nd no show, charge or no charge?

## 2016-01-29 NOTE — Telephone Encounter (Signed)
Marked to charge and mailing no show letter °

## 2016-02-16 ENCOUNTER — Ambulatory Visit (INDEPENDENT_AMBULATORY_CARE_PROVIDER_SITE_OTHER): Payer: 59 | Admitting: Internal Medicine

## 2016-02-16 ENCOUNTER — Encounter: Payer: Self-pay | Admitting: Internal Medicine

## 2016-02-16 VITALS — BP 118/66 | HR 62 | Temp 97.8°F | Resp 14 | Ht 69.0 in | Wt 166.1 lb

## 2016-02-16 DIAGNOSIS — F17200 Nicotine dependence, unspecified, uncomplicated: Secondary | ICD-10-CM | POA: Diagnosis not present

## 2016-02-16 DIAGNOSIS — R202 Paresthesia of skin: Secondary | ICD-10-CM

## 2016-02-16 DIAGNOSIS — F329 Major depressive disorder, single episode, unspecified: Secondary | ICD-10-CM

## 2016-02-16 DIAGNOSIS — F32A Depression, unspecified: Secondary | ICD-10-CM

## 2016-02-16 NOTE — Progress Notes (Signed)
Subjective:    Patient ID: Curtis [name], male    DOB: 07-Jan-1956, 60 y.o.   MRN: [id number]  DOS:  02/16/2016 Type of visit - description : Routine office visit Interval history: Depression, on Effexor, symptoms well-controlled. Whenever he forgets to take 1 or 2 doses he feels off. Complain of numbness/tingling at the right hand (ulnar and radial side) usually at night the days he has been very active at home doing some repairs. Denies lower extremity paresthesias, neck pain at baseline. Still smoking. Not ready to quit.  Review of Systems   Past Medical History:  Diagnosis Date  . COPD (chronic obstructive pulmonary disease) ([place])   . Depression   . Neck pain, chronic     Past Surgical History:  Procedure Laterality Date  . HERNIA REPAIR  2009   right  . KNEE SURGERY Right 1990s   arthroscopy    Social History   Social History  . Marital status: Married    Spouse name: N/A  . Number of children: 4  . 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 Smoking status: Current Every Day Smoker    Packs/day: 1.30    Years: 40.00    Types: Cigarettes  . Smokeless tobacco: Never Used     Comment: 1ppd + occ e cigarret  . Alcohol use Yes     Comment: socially   . Drug use: No  . Sexual activity: Not on file   Other Topics Concern  . Not on file   Social History Narrative   Lives w/ wife   4 children , 2 at home                  Medication List       Accurate as of 02/16/16 11:59 PM. Always use your most recent med list.          CALCIUM-VITAMIN D3 PO Take by mouth daily.   Fish Oil 1000 MG Caps Take by mouth daily.   multivitamin tablet Take 1 tablet by mouth daily.   PROBIOTIC DAILY Caps Take by mouth daily.   venlafaxine XR 37.5 MG 24 hr capsule Commonly known as:  EFFEXOR-XR Take 1 capsule (37.5 mg total) by mouth daily with breakfast.   vitamin B-12 1000 MCG tablet Commonly  known as:  CYANOCOBALAMIN Take 1,000 mcg by mouth daily.          Objective:   Physical Exam BP 118/66 (BP Location: Left Arm, Patient Position: Sitting, Cuff Size: Normal)   Pulse 62   Temp 97.8 F (36.6 C) (Oral)   Resp 14   Ht 5\' 9"  (1.753 m)   Wt 166 lb 2 oz (75.4 kg)   SpO2 96%   BMI 24.53 kg/m  General:   Well developed, well nourished . NAD.  HEENT:  Normocephalic . Face symmetric, atraumatic Lungs:  CTA B Normal respiratory effort, no intercostal retractions, no accessory muscle use. Heart: RRR,  no murmur.  No pretibial edema bilaterally  Skin: Not pale. Not jaundice Neurologic:  alert & oriented X3.  Speech normal, gait appropriate for age and unassisted. DTRs and motor symmetric phalen's test questionable + Psych--  Cognition and judgment appear intact.  Cooperative with normal attention span and concentration.  Behavior appropriate. No anxious or depressed appearing.      Assessment & Plan:   Assessment  Depression - on effexor COPD, smoker. PFTs 11-2012  mild COPD Chronic neck pain --- saw Dr Mina Marble 2012 BPH     PLAN  Depression: On Effexor, still under a lot of stress,sx controlled ok. we agreed to continue on medications. Tobacco abuse: Not ready to quit, encouraged to think about it and let me know when he is ready Lung cancer screening: So far has elected not to proceed, will call if he changes his mind Right hand paresthesias:  CTS? Related to a radiculopathy given extensive DJD? For now recommend a trial with the CTS a splinter since sx are mild and not persistent. If something changes he will let me know. RTC 4 months, CPX

## 2016-02-16 NOTE — Patient Instructions (Signed)
GO TO THE FRONT DESK Schedule your next appointment for a  Physical exam in 3-4 months   Use a Carpal Tunnel Splinter at night as needed   Call when/if ready for a lung cancer screen (CT)

## 2016-02-16 NOTE — Progress Notes (Signed)
Pre visit review using our clinic review tool, if applicable. No additional management support is needed unless otherwise documented below in the visit note. 

## 2016-02-17 NOTE — Assessment & Plan Note (Signed)
Depression: On Effexor, still under a lot of stress,sx controlled ok. we agreed to continue on medications. Tobacco abuse: Not ready to quit, encouraged to think about it and let me know when he is ready Lung cancer screening: So far has elected not to proceed, will call if he changes his mind Right hand paresthesias:  CTS? Related to a radiculopathy given extensive DJD? For now recommend a trial with the CTS a splinter since sx are mild and not persistent. If something changes he will let me know. RTC 4 months, CPX

## 2016-04-05 ENCOUNTER — Other Ambulatory Visit: Payer: Self-pay | Admitting: Internal Medicine

## 2016-05-25 ENCOUNTER — Ambulatory Visit (INDEPENDENT_AMBULATORY_CARE_PROVIDER_SITE_OTHER): Payer: 59 | Admitting: Internal Medicine

## 2016-05-25 ENCOUNTER — Encounter: Payer: Self-pay | Admitting: Internal Medicine

## 2016-05-25 VITALS — BP 108/68 | HR 76 | Temp 98.1°F | Resp 14 | Ht 69.0 in | Wt 167.2 lb

## 2016-05-25 DIAGNOSIS — Z23 Encounter for immunization: Secondary | ICD-10-CM | POA: Diagnosis not present

## 2016-05-25 DIAGNOSIS — Z0000 Encounter for general adult medical examination without abnormal findings: Secondary | ICD-10-CM
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
## 2016-05-25 NOTE — Assessment & Plan Note (Addendum)
Td 08 ; Pneumonia shot 2012; prevnar: 2016;  Flu shot--today Will be interested in a shingles shot at some point  CCS: Cscope in 2004 for BRBPR >> hemorrhoids and tics Cscope again 03-2010, tics, next in 5 to 10 years ( Dr Watt Climes ); will discuss redo colonoscopy next year 2018 Prostate cancer screening: DRE 2015 --->asymmetric prostate , saw  Urology, after a examination they felt he has BPH . DRE today is again asymmetric. Will check a PSA.  discussed diet-exercise labs :  PSA, FLP Tobacco cessation discussed again. Lung cancer screening was ordered, was not able to proceed , will call if interested  RTC 8 months

## 2016-05-25 NOTE — Patient Instructions (Signed)
GO TO THE LAB : Get the blood work     GO TO THE FRONT DESK Schedule your next appointment for a   checkup in 8 months 

## 2016-05-25 NOTE — Progress Notes (Signed)
Pre visit review using our clinic review tool, if applicable. No additional management support is needed unless otherwise documented below in the visit note. 

## 2016-05-26 LAB — LIPID PANEL
Cholesterol: 179 mg/dL (ref 0–200)
HDL: 64.3 mg/dL (ref >39.00)
LDL Cholesterol: 100 mg/dL — ABNORMAL HIGH (ref 0–99)
NONHDL: 114.91
Total CHOL/HDL Ratio: 3
Triglycerides: 75 mg/dL (ref 0.0–149.0)
VLDL: 15 mg/dL (ref 0.0–40.0)

## 2016-05-26 LAB — PSA: PSA: 3.22 ng/mL (ref 0.10–4.00)

## 2016-05-27 NOTE — Assessment & Plan Note (Signed)
Depression: Controlled COPD: Essentially asx RTC 8 months

## 2016-07-26 DIAGNOSIS — H43811 Vitreous degeneration, right eye: Secondary | ICD-10-CM | POA: Diagnosis not present

## 2016-08-23 DIAGNOSIS — H43811 Vitreous degeneration, right eye: Secondary | ICD-10-CM | POA: Diagnosis not present

## 2016-10-26 DIAGNOSIS — J939 Pneumothorax, unspecified: Secondary | ICD-10-CM

## 2016-10-26 DIAGNOSIS — S2249XA Multiple fractures of ribs, unspecified side, initial encounter for closed fracture: Secondary | ICD-10-CM

## 2016-10-26 HISTORY — DX: Pneumothorax, unspecified: J93.9

## 2016-10-26 HISTORY — DX: Multiple fractures of ribs, unspecified side, initial encounter for closed fracture: S22.49XA

## 2016-10-26 HISTORY — PX: CHEST TUBE INSERTION: SHX231

## 2016-10-29 ENCOUNTER — Inpatient Hospital Stay (HOSPITAL_COMMUNITY)
Admission: EM | Admit: 2016-10-29 | Discharge: 2016-11-04 | DRG: 184 | Disposition: A | Discharge status: Home / Self Care | Payer: 59 | Attending: Surgery | Admitting: Surgery

## 2016-10-29 ENCOUNTER — Emergency Department (HOSPITAL_COMMUNITY): Payer: 59

## 2016-10-29 DIAGNOSIS — M546 Pain in thoracic spine: Secondary | ICD-10-CM | POA: Diagnosis not present

## 2016-10-29 DIAGNOSIS — J939 Pneumothorax, unspecified: Secondary | ICD-10-CM | POA: Diagnosis not present

## 2016-10-29 DIAGNOSIS — W010XXA Fall on same level from slipping, tripping and stumbling without subsequent striking against object, initial encounter: Secondary | ICD-10-CM | POA: Diagnosis present

## 2016-10-29 DIAGNOSIS — Z9689 Presence of other specified functional implants: Secondary | ICD-10-CM

## 2016-10-29 DIAGNOSIS — Z825 Family history of asthma and other chronic lower respiratory diseases: Secondary | ICD-10-CM

## 2016-10-29 DIAGNOSIS — S2241XA Multiple fractures of ribs, right side, initial encounter for closed fracture: Secondary | ICD-10-CM | POA: Diagnosis not present

## 2016-10-29 DIAGNOSIS — J9382 Other air leak: Secondary | ICD-10-CM | POA: Diagnosis present

## 2016-10-29 DIAGNOSIS — T797XXA Traumatic subcutaneous emphysema, initial encounter: Secondary | ICD-10-CM | POA: Diagnosis present

## 2016-10-29 DIAGNOSIS — J449 Chronic obstructive pulmonary disease, unspecified: Secondary | ICD-10-CM | POA: Diagnosis present

## 2016-10-29 DIAGNOSIS — Z833 Family history of diabetes mellitus: Secondary | ICD-10-CM

## 2016-10-29 DIAGNOSIS — J9811 Atelectasis: Secondary | ICD-10-CM | POA: Diagnosis not present

## 2016-10-29 DIAGNOSIS — W19XXXA Unspecified fall, initial encounter: Secondary | ICD-10-CM

## 2016-10-29 DIAGNOSIS — Z9889 Other specified postprocedural states: Secondary | ICD-10-CM

## 2016-10-29 DIAGNOSIS — G8929 Other chronic pain: Secondary | ICD-10-CM | POA: Diagnosis present

## 2016-10-29 DIAGNOSIS — S2249XA Multiple fractures of ribs, unspecified side, initial encounter for closed fracture: Secondary | ICD-10-CM

## 2016-10-29 DIAGNOSIS — F329 Major depressive disorder, single episode, unspecified: Secondary | ICD-10-CM | POA: Diagnosis present

## 2016-10-29 DIAGNOSIS — J9383 Other pneumothorax: Secondary | ICD-10-CM | POA: Diagnosis present

## 2016-10-29 DIAGNOSIS — N400 Enlarged prostate without lower urinary tract symptoms: Secondary | ICD-10-CM | POA: Diagnosis present

## 2016-10-29 DIAGNOSIS — F17210 Nicotine dependence, cigarettes, uncomplicated: Secondary | ICD-10-CM | POA: Diagnosis present

## 2016-10-29 DIAGNOSIS — Z79899 Other long term (current) drug therapy: Secondary | ICD-10-CM

## 2016-10-29 DIAGNOSIS — M542 Cervicalgia: Secondary | ICD-10-CM | POA: Diagnosis present

## 2016-10-29 DIAGNOSIS — T148XXA Other injury of unspecified body region, initial encounter: Secondary | ICD-10-CM | POA: Diagnosis not present

## 2016-10-29 HISTORY — DX: Pneumothorax, unspecified: J93.9

## 2016-10-29 LAB — CBC WITH DIFFERENTIAL/PLATELET
BASOS ABS: 0 10*3/uL (ref 0.0–0.1)
Basophils Relative: 0 %
EOS ABS: 0 10*3/uL (ref 0.0–0.7)
Eosinophils Relative: 0 %
HEMATOCRIT: 42.5 % (ref 39.0–52.0)
Hemoglobin: 14.6 g/dL (ref 13.0–17.0)
Lymphocytes Relative: 8 %
Lymphs Abs: 1.4 10*3/uL (ref 0.7–4.0)
MCH: 30.9 pg (ref 26.0–34.0)
MCHC: 34.4 g/dL (ref 30.0–36.0)
MCV: 90 fL (ref 78.0–100.0)
MONO ABS: 0.8 10*3/uL (ref 0.1–1.0)
MONOS PCT: 5 %
Neutro Abs: 15.7 10*3/uL — ABNORMAL HIGH (ref 1.7–7.7)
Neutrophils Relative %: 87 %
PLATELETS: 192 10*3/uL (ref 150–400)
RBC: 4.72 MIL/uL (ref 4.22–5.81)
RDW: 13.5 % (ref 11.5–15.5)
WBC: 17.9 10*3/uL — ABNORMAL HIGH (ref 4.0–10.5)
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
## 2016-10-29 NOTE — ED Triage Notes (Signed)
Per EMS, pt from home for evaluation after fall from table. Pt landed on back, denies LOC, c/o right shoulder pain and shortness of breath. Lung sounds clear. EMS vitals: BP-116/74, P-60, SpO2-92% room air.

## 2016-10-29 NOTE — ED Provider Notes (Addendum)
Patient refused reportedly fell backwards landing flat on his back. He complains of dyspnea. This event. On exam he is alert Glasgow Coma Score 15. Lungs clear auscultation   Orlie Dakin, MD 10/29/16 2245 ED ECG REPORT   Date: 10/29/2016  Rate: 70  Rhythm: normal sinus rhythm  QRS Axis: normal  Intervals: normal  ST/T Wave abnormalities: nonspecific T wave changes  Conduction Disutrbances:nonspecific intraventricular conduction delay  Narrative Interpretation:   Old EKG Reviewed: Rate increased over 10/26/2012  I have personally reviewed the EKG tracing and agree with the computerized printout as noted.   Orlie Dakin, MD 10/29/16 [place], MD 10/29/16 2255

## 2016-10-30 ENCOUNTER — Encounter (HOSPITAL_COMMUNITY): Payer: Self-pay | Admitting: *Deleted

## 2016-10-30 ENCOUNTER — Observation Stay (HOSPITAL_COMMUNITY): Payer: 59

## 2016-10-30 DIAGNOSIS — S2249XA Multiple fractures of ribs, unspecified side, initial encounter for closed fracture: Secondary | ICD-10-CM | POA: Diagnosis present

## 2016-10-30 DIAGNOSIS — S270XXA Traumatic pneumothorax, initial encounter: Secondary | ICD-10-CM | POA: Diagnosis not present

## 2016-10-30 DIAGNOSIS — S2241XA Multiple fractures of ribs, right side, initial encounter for closed fracture: Secondary | ICD-10-CM | POA: Diagnosis not present

## 2016-10-30 LAB — BASIC METABOLIC PANEL
Anion gap: 6 (ref 5–15)
BUN: 21 mg/dL — ABNORMAL HIGH (ref 6–20)
CALCIUM: 9.2 mg/dL (ref 8.9–10.3)
CHLORIDE: 107 mmol/L (ref 101–111)
CO2: 26 mmol/L (ref 22–32)
CREATININE: 1.2 mg/dL (ref 0.61–1.24)
GFR calc non Af Amer: >60 mL/min (ref >60)
Glucose, Bld: 133 mg/dL — ABNORMAL HIGH (ref 65–99)
Potassium: 4 mmol/L (ref 3.5–5.1)
SODIUM: 139 mmol/L (ref 135–145)

## 2016-10-30 LAB — HIV ANTIBODY (ROUTINE TESTING W REFLEX): HIV SCREEN 4TH GENERATION: NONREACTIVE

## 2016-10-30 MED ORDER — ACETAMINOPHEN 500 MG PO TABS
1000.0000 mg | ORAL_TABLET | Freq: Three times a day (TID) | ORAL | Status: DC
  Administered 2016-10-30 – 2016-11-04 (×15): 1000 mg via ORAL
  Filled 2016-10-30 (×16): qty 2

## 2016-10-30 MED ORDER — HYDROMORPHONE HCL 1 MG/ML IJ SOLN: 1.0000 mg | INTRAMUSCULAR | Status: DC | PRN

## 2016-10-30 MED ORDER — MORPHINE SULFATE (PF) 4 MG/ML IV SOLN: 2.0000 mg | INTRAVENOUS | Status: DC | PRN

## 2016-10-30 MED ORDER — ONDANSETRON HCL 4 MG PO TABS: 4.0000 mg | ORAL_TABLET | Freq: Four times a day (QID) | ORAL | Status: DC | PRN

## 2016-10-30 MED ORDER — IBUPROFEN 600 MG PO TABS
600.0000 mg | ORAL_TABLET | Freq: Four times a day (QID) | ORAL | Status: DC | PRN
  Administered 2016-10-31 – 2016-11-03 (×3): 600 mg via ORAL
  Filled 2016-10-30 (×4): qty 1

## 2016-10-30 MED ORDER — OXYCODONE HCL 5 MG PO TABS: 2.5000 mg | ORAL_TABLET | ORAL | Status: DC | PRN

## 2016-10-30 MED ORDER — MORPHINE SULFATE (PF) 4 MG/ML IV SOLN: 1.0000 mg | INTRAVENOUS | Status: DC | PRN

## 2016-10-30 MED ORDER — OXYCODONE HCL 5 MG PO TABS: 10.0000 mg | ORAL_TABLET | ORAL | Status: DC | PRN

## 2016-10-30 MED ORDER — OXYCODONE HCL 5 MG PO TABS
5.0000 mg | ORAL_TABLET | ORAL | Status: DC | PRN
Start: 2016-10-30 — End: 2016-10-30

## 2016-10-30 MED ORDER — HYDROMORPHONE HCL 1 MG/ML IJ SOLN
0.5000 mg | INTRAMUSCULAR | Status: DC | PRN
  Administered 2016-10-31 – 2016-11-03 (×3): 0.5 mg via INTRAVENOUS
  Filled 2016-10-30 (×3): qty 1

## 2016-10-30 MED ORDER — ENOXAPARIN SODIUM 40 MG/0.4ML ~~LOC~~ SOLN
40.0000 mg | SUBCUTANEOUS | Status: DC
  Administered 2016-10-30 – 2016-11-04 (×5): 40 mg via SUBCUTANEOUS
  Filled 2016-10-30 (×6): qty 0.4

## 2016-10-30 MED ORDER — OXYCODONE HCL 5 MG PO TABS
5.0000 mg | ORAL_TABLET | ORAL | Status: DC | PRN
  Administered 2016-10-30: 5 mg via ORAL
  Filled 2016-10-30 (×2): qty 1

## 2016-10-30 MED ORDER — METHOCARBAMOL 500 MG PO TABS
500.0000 mg | ORAL_TABLET | Freq: Four times a day (QID) | ORAL | Status: DC | PRN
  Administered 2016-10-31 – 2016-11-04 (×8): 500 mg via ORAL
  Filled 2016-10-30 (×8): qty 1

## 2016-10-30 MED ORDER — OXYCODONE HCL 5 MG PO TABS
5.0000 mg | ORAL_TABLET | ORAL | Status: DC | PRN
  Administered 2016-10-31 – 2016-11-04 (×8): 5 mg via ORAL
  Filled 2016-10-30 (×9): qty 1

## 2016-10-30 MED ORDER — KETOROLAC TROMETHAMINE 30 MG/ML IJ SOLN
30.0000 mg | Freq: Three times a day (TID) | INTRAMUSCULAR | Status: DC
  Administered 2016-10-30 (×2): 30 mg via INTRAVENOUS
  Filled 2016-10-30 (×2): qty 1

## 2016-10-30 MED ORDER — MORPHINE SULFATE (PF) 4 MG/ML IV SOLN: 4.0000 mg | INTRAVENOUS | Status: DC | PRN

## 2016-10-30 MED ORDER — GABAPENTIN 600 MG PO TABS
300.0000 mg | ORAL_TABLET | Freq: Three times a day (TID) | ORAL | Status: DC
  Administered 2016-10-30 – 2016-11-04 (×14): 300 mg via ORAL
  Filled 2016-10-30 (×16): qty 1

## 2016-10-30 MED ORDER — ONDANSETRON HCL 4 MG/2ML IJ SOLN: 4.0000 mg | Freq: Four times a day (QID) | INTRAMUSCULAR | Status: DC | PRN
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
## 2016-10-30 NOTE — ED Notes (Signed)
Pt placed on NRB per verbal order [place], [place]

## 2016-10-31 ENCOUNTER — Observation Stay (HOSPITAL_COMMUNITY): Payer: 59

## 2016-10-31 DIAGNOSIS — Z825 Family history of asthma and other chronic lower respiratory diseases: Secondary | ICD-10-CM | POA: Diagnosis not present

## 2016-10-31 DIAGNOSIS — S2239XA Fracture of one rib, unspecified side, initial encounter for closed fracture: Secondary | ICD-10-CM | POA: Diagnosis not present

## 2016-10-31 DIAGNOSIS — F329 Major depressive disorder, single episode, unspecified: Secondary | ICD-10-CM | POA: Diagnosis present

## 2016-10-31 DIAGNOSIS — Z79899 Other long term (current) drug therapy: Secondary | ICD-10-CM | POA: Diagnosis not present

## 2016-10-31 DIAGNOSIS — S270XXA Traumatic pneumothorax, initial encounter: Secondary | ICD-10-CM | POA: Diagnosis not present

## 2016-10-31 DIAGNOSIS — T797XXA Traumatic subcutaneous emphysema, initial encounter: Secondary | ICD-10-CM | POA: Diagnosis present

## 2016-10-31 DIAGNOSIS — W010XXA Fall on same level from slipping, tripping and stumbling without subsequent striking against object, initial encounter: Secondary | ICD-10-CM | POA: Diagnosis present

## 2016-10-31 DIAGNOSIS — Z833 Family history of diabetes mellitus: Secondary | ICD-10-CM | POA: Diagnosis not present

## 2016-10-31 DIAGNOSIS — M542 Cervicalgia: Secondary | ICD-10-CM | POA: Diagnosis present

## 2016-10-31 DIAGNOSIS — G8929 Other chronic pain: Secondary | ICD-10-CM | POA: Diagnosis present

## 2016-10-31 DIAGNOSIS — F17210 Nicotine dependence, cigarettes, uncomplicated: Secondary | ICD-10-CM | POA: Diagnosis present

## 2016-10-31 DIAGNOSIS — J449 Chronic obstructive pulmonary disease, unspecified: Secondary | ICD-10-CM | POA: Diagnosis present

## 2016-10-31 DIAGNOSIS — J9383 Other pneumothorax: Secondary | ICD-10-CM | POA: Diagnosis not present

## 2016-10-31 DIAGNOSIS — N400 Enlarged prostate without lower urinary tract symptoms: Secondary | ICD-10-CM | POA: Diagnosis present

## 2016-10-31 DIAGNOSIS — J9382 Other air leak: Secondary | ICD-10-CM | POA: Diagnosis present

## 2016-10-31 DIAGNOSIS — Z4682 Encounter for fitting and adjustment of non-vascular catheter: Secondary | ICD-10-CM | POA: Diagnosis not present

## 2016-10-31 DIAGNOSIS — S2241XA Multiple fractures of ribs, right side, initial encounter for closed fracture: Secondary | ICD-10-CM | POA: Diagnosis not present

## 2016-10-31 DIAGNOSIS — J9811 Atelectasis: Secondary | ICD-10-CM | POA: Diagnosis not present

## 2016-10-31 DIAGNOSIS — J939 Pneumothorax, unspecified: Secondary | ICD-10-CM | POA: Diagnosis not present

## 2016-10-31 MED ORDER — NICOTINE 14 MG/24HR TD PT24
14.0000 mg | MEDICATED_PATCH | Freq: Every day | TRANSDERMAL | Status: DC
  Administered 2016-10-31 – 2016-11-04 (×5): 14 mg via TRANSDERMAL
  Filled 2016-10-31 (×5): qty 1

## 2016-10-31 MED ORDER — LIDOCAINE-EPINEPHRINE (PF) 1 %-1:200000 IJ SOLN
20.0000 mL | Freq: Once | INTRAMUSCULAR | Status: DC
Start: 2016-10-31 — End: 2016-10-31
  Filled 2016-10-31: qty 20

## 2016-10-31 MED ORDER — LIDOCAINE-EPINEPHRINE 2 %-1:100000 IJ SOLN
20.0000 mL | Freq: Once | INTRAMUSCULAR | Status: DC
  Filled 2016-10-31: qty 20

## 2016-10-31 MED ORDER — VENLAFAXINE HCL ER 37.5 MG PO CP24
37.5000 mg | ORAL_CAPSULE | Freq: Every day | ORAL | Status: DC
  Administered 2016-11-01 – 2016-11-04 (×4): 37.5 mg via ORAL
  Filled 2016-10-31 (×4): qty 1

## 2016-10-31 NOTE — Progress Notes (Signed)
Pt has an oral temp of 102.3. Scheduled tylenol given. Encourage incentive spirometer. Other VSS. Dr. Ninfa Linden made aware. No new orders at this time.

## 2016-10-31 NOTE — Procedures (Signed)
Chest Tube Insertion Procedure Note  Indications:  Clinically significant Pneumothorax  Pre-operative Diagnosis: Pneumothorax  Post-operative Diagnosis: Pneumothorax  Procedure Details  Informed consent was obtained for the procedure, including sedation.  Risks of lung perforation, hemorrhage, arrhythmia, and adverse drug reaction were discussed.   After sterile skin prep, using standard technique, a 14 French pigtail catheter was placed in the right lateral 5th rib space using seldinger technique.  Findings: Air aspirated from chest cavity on entry  Estimated Blood Loss:  Minimal         Specimens:  None              Complications:  None; patient tolerated the procedure well.         Disposition: flooe, stable         Condition: stable  Attending Attestation: I performed the procedure.
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
## 2016-11-01 ENCOUNTER — Inpatient Hospital Stay (HOSPITAL_COMMUNITY): Payer: 59

## 2016-11-01 LAB — CBC
HEMATOCRIT: 43.4 % (ref 39.0–52.0)
HEMOGLOBIN: 14.4 g/dL (ref 13.0–17.0)
MCH: 29.9 pg (ref 26.0–34.0)
MCHC: 33.2 g/dL (ref 30.0–36.0)
MCV: 90 fL (ref 78.0–100.0)
PLATELETS: 152 10*3/uL (ref 150–400)
RBC: 4.82 MIL/uL (ref 4.22–5.81)
RDW: 13.4 % (ref 11.5–15.5)
WBC: 16.7 10*3/uL — AB (ref 4.0–10.5)

## 2016-11-01 LAB — BASIC METABOLIC PANEL
ANION GAP: 7 (ref 5–15)
BUN: 16 mg/dL (ref 6–20)
CHLORIDE: 105 mmol/L (ref 101–111)
CO2: 26 mmol/L (ref 22–32)
Calcium: 8.9 mg/dL (ref 8.9–10.3)
Creatinine, Ser: 0.85 mg/dL (ref 0.61–1.24)
GFR calc Af Amer: >60 mL/min (ref >60)
Glucose, Bld: 119 mg/dL — ABNORMAL HIGH (ref 65–99)
POTASSIUM: 4.5 mmol/L (ref 3.5–5.1)
Sodium: 138 mmol/L (ref 135–145)

## 2016-11-01 NOTE — Care Management Note (Addendum)
Case Management Note  Patient Details  Name: Curtis [name] MRN: [id number] Date of Birth: 1956/06/06  Subjective/Objective:   Pt admitted on 10/30/16 s/p fall with multiple Rt sided rib fractures and pneumothorax.  The patient required chest tube placement on 10/31/16.  PTA, pt independent, lives with spouse.                    Action/Plan: Supportive son at bedside.  Pt states family to provide care at discharge.  Will follow for discharge needs as pt progresses.    Expected Discharge Date:                 Expected Discharge Plan:  Home/Self Care  In-House Referral:     Discharge planning Services  CM Consult  Post Acute Care Choice:    Choice offered to:     DME Arranged:    DME Agency:     HH Arranged:    HH Agency:     Status of Service:  In process, will continue to follow  If discussed at Long Length of Stay Meetings, dates discussed:    Additional Comments:  Reinaldo Raddle, RN, BSN  Trauma/Neuro ICU Case Manager [phone]
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
## 2016-11-02 ENCOUNTER — Inpatient Hospital Stay (HOSPITAL_COMMUNITY): Payer: 59
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
## 2016-11-03 ENCOUNTER — Other Ambulatory Visit: Payer: Self-pay | Admitting: Internal Medicine

## 2016-11-03 ENCOUNTER — Encounter (HOSPITAL_COMMUNITY): Payer: Self-pay | Admitting: General Practice

## 2016-11-03 ENCOUNTER — Inpatient Hospital Stay (HOSPITAL_COMMUNITY): Payer: 59

## 2016-11-03 LAB — GLUCOSE, CAPILLARY: GLUCOSE-CAPILLARY: 93 mg/dL (ref 65–99)
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
## 2016-11-04 ENCOUNTER — Inpatient Hospital Stay (HOSPITAL_COMMUNITY): Payer: 59

## 2016-11-04 MED ORDER — OXYCODONE HCL 5 MG PO TABS: 5.0000 mg | ORAL_TABLET | Freq: Three times a day (TID) | ORAL | 0 refills | Status: DC | PRN

## 2016-11-04 MED ORDER — NICOTINE 14 MG/24HR TD PT24: 14.0000 mg | MEDICATED_PATCH | Freq: Every day | TRANSDERMAL | 0 refills | Status: DC

## 2016-11-04 MED ORDER — METHOCARBAMOL 500 MG PO TABS: 500.0000 mg | ORAL_TABLET | Freq: Four times a day (QID) | ORAL | 0 refills | Status: DC | PRN
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
## 2016-11-04 NOTE — Progress Notes (Signed)
Pt discharged home in stable condition. Voiced understanding the discharge instructions.AVS and discharge scripts given before leaving unit

## 2016-11-04 NOTE — Discharge Instructions (Signed)
You will need to have a chest xray done at [place] the day prior to your appointment. Continue using incentive spirometer. Walking is great exercise but do not over strain yourself with strenuous activity. Call if you start to have increased shortness of breath, fever, or productive cough.  Pneumothorax A pneumothorax, commonly called a collapsed lung, is a condition in which air leaks from a lung and builds up in the space between the lung and the chest wall (pleural space). The air in a pneumothorax is trapped outside the lung and takes up space, preventing the lung from fully expanding. This is a condition that usually occurs suddenly. The buildup of air may be small or large. A small pneumothorax may go away on its own. When a pneumothorax is larger, it will often require medical treatment and hospitalization. What are the causes? A pneumothorax can sometimes happen quickly with no apparent cause. People with underlying lung problems, particularly COPD or emphysema, are at higher risk of pneumothorax. However, pneumothorax can happen quickly even in people with no prior known lung problems. Trauma, surgery, medical procedures, or injury to the chest wall can also cause a pneumothorax. What are the signs or symptoms? Sometimes a pneumothorax will have no symptoms. When symptoms are present, they can include:  Chest pain.  Shortness of breath.  Increased rate of breathing.  Bluish color to your lips or skin (cyanosis). How is this diagnosed? Pneumothorax is usually diagnosed by a chest X-ray or chest CT scan. Your health care provider will also take a medical history and perform a physical exam to determine why you may have a pneumothorax. How is this treated? A small pneumothorax may go away on its own without treatment. Extra oxygen can sometimes help a small pneumothorax go away more quickly. For a larger pneumothorax or a pneumothorax that is causing symptoms, a procedure is  usually needed to drain the air.In some cases, the health care provider may drain the air using a needle. In other cases, a chest tube may be inserted into the pleural space. A chest tube is a small tube placed between the ribs and into the pleural space. This removes the extra air and allows the lung to expand back to its normal size. A large pneumothorax will usually require a hospital stay. If there is ongoing air leakage into the pleural space, then the chest tube may need to remain in place for several days until the air leak has healed. In some cases, surgery may be needed. Follow these instructions at home:  Only take over-the-counter or prescription medicines as directed by your health care provider.  If a cough or pain makes it difficult for you to sleep at night, try sleeping in a semi-upright position in a recliner or by using 2 or 3 pillows.  Rest and limit activity as directed by your health care provider.  If you had a chest tube and it was removed, ask your health care provider when it is okay to remove the dressing. Until your health care provider says you can remove the dressing, do not allow it to get wet.  Do not smoke. Smoking is a risk factor for pneumothorax.  Do not fly in an airplane or scuba dive until your health care provider says it is okay.  Follow up with your health care provider as directed. Get help right away if:  You have increasing chest pain or shortness of breath.  You have a cough that is not controlled with  suppressants.  You begin coughing up blood.  You have pain that is getting worse or is not controlled with medicines.  You cough up thick, discolored mucus (sputum) that is yellow to green in color.  You have redness, increasing pain, or discharge at the site where a chest tube had been in place (if your pneumothorax was treated with a chest tube).  The site where your chest tube was located opens up.  You feel air coming out of the site  where the chest tube was placed.  You have a fever or persistent symptoms for more than 2-3 days.  You have a fever and your symptoms suddenly get worse. This information is not intended to replace advice given to you by your health care provider. Make sure you discuss any questions you have with your health care provider. Document Released: 06/14/2005 Document Revised: 11/20/2015 Document Reviewed: 11/07/2013 Elsevier Interactive Patient Education  2017 [employer].

## 2016-11-10 ENCOUNTER — Other Ambulatory Visit: Payer: Self-pay | Admitting: Physician Assistant

## 2016-11-10 ENCOUNTER — Ambulatory Visit
Admission: RE | Admit: 2016-11-10 | Discharge: 2016-11-10 | Disposition: A | Discharge status: Home / Self Care | Payer: 59 | Source: Ambulatory Visit | Attending: Physician Assistant | Admitting: Physician Assistant

## 2016-11-10 DIAGNOSIS — S2241XA Multiple fractures of ribs, right side, initial encounter for closed fracture: Secondary | ICD-10-CM | POA: Diagnosis not present

## 2016-11-10 DIAGNOSIS — J939 Pneumothorax, unspecified: Secondary | ICD-10-CM

## 2016-11-11 DIAGNOSIS — J939 Pneumothorax, unspecified: Secondary | ICD-10-CM | POA: Diagnosis not present

## 2016-12-03 ENCOUNTER — Other Ambulatory Visit: Payer: Self-pay | Admitting: Internal Medicine
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
## 2016-12-03 NOTE — Telephone Encounter (Signed)
Rx sent 

## 2016-12-20 DIAGNOSIS — H1089 Other conjunctivitis: Secondary | ICD-10-CM | POA: Diagnosis not present

## 2017-01-21 ENCOUNTER — Ambulatory Visit: Payer: 59 | Admitting: Internal Medicine

## 2017-02-09 ENCOUNTER — Encounter: Payer: Self-pay | Admitting: Internal Medicine

## 2017-02-09 ENCOUNTER — Ambulatory Visit (INDEPENDENT_AMBULATORY_CARE_PROVIDER_SITE_OTHER): Payer: 59 | Admitting: Internal Medicine

## 2017-02-09 VITALS — BP 124/68 | HR 62 | Temp 98.3°F | Resp 14 | Ht 69.0 in | Wt 170.4 lb

## 2017-02-09 DIAGNOSIS — F329 Major depressive disorder, single episode, unspecified: Secondary | ICD-10-CM | POA: Diagnosis not present

## 2017-02-09 DIAGNOSIS — F17200 Nicotine dependence, unspecified, uncomplicated: Secondary | ICD-10-CM | POA: Diagnosis not present

## 2017-02-09 DIAGNOSIS — F32A Depression, unspecified: Secondary | ICD-10-CM

## 2017-02-09 NOTE — Assessment & Plan Note (Signed)
Depression: Controlled on Effexor Smoker: Quit tobacco 10/2016, vapes but plans to quit vaping as well; praised! Pneumothorax: After a fall 10/2016, seems to be recovering well Calf numbness: As described above, also calf is 1 cm larger in circumference (L calf atrophy?), I doubt DVT as there is no tenderness, swelling. Recommend observation, will call if pain or swelling. RTC 4 months, CPX

## 2017-02-09 NOTE — Progress Notes (Signed)
Pre visit review using our clinic review tool, if applicable. No additional management support is needed unless otherwise documented below in the visit note. 
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
## 2017-02-09 NOTE — Patient Instructions (Signed)
  GO TO THE FRONT DESK Schedule your next appointment for a  Physical exam in about 4 months  , fasting

## 2017-06-14 ENCOUNTER — Ambulatory Visit (INDEPENDENT_AMBULATORY_CARE_PROVIDER_SITE_OTHER): Payer: 59 | Admitting: Internal Medicine

## 2017-06-14 ENCOUNTER — Encounter: Payer: Self-pay | Admitting: Internal Medicine

## 2017-06-14 VITALS — BP 116/68 | HR 63 | Temp 98.0°F | Resp 14 | Ht 69.0 in | Wt 168.5 lb

## 2017-06-14 DIAGNOSIS — Z125 Encounter for screening for malignant neoplasm of prostate: Secondary | ICD-10-CM | POA: Diagnosis not present

## 2017-06-14 DIAGNOSIS — Z0000 Encounter for general adult medical examination without abnormal findings: Secondary | ICD-10-CM | POA: Diagnosis not present

## 2017-06-14 DIAGNOSIS — Z23 Encounter for immunization: Secondary | ICD-10-CM | POA: Diagnosis not present

## 2017-06-14 LAB — CBC WITH DIFFERENTIAL/PLATELET
Basophils Absolute: 0 10*3/uL (ref 0.0–0.1)
Basophils Relative: 0.7 % (ref 0.0–3.0)
EOS PCT: 1.6 % (ref 0.0–5.0)
Eosinophils Absolute: 0.1 10*3/uL (ref 0.0–0.7)
HEMATOCRIT: 46.7 % (ref 39.0–52.0)
HEMOGLOBIN: 15.2 g/dL (ref 13.0–17.0)
LYMPHS PCT: 23.8 % (ref 12.0–46.0)
Lymphs Abs: 1.5 10*3/uL (ref 0.7–4.0)
MCHC: 32.6 g/dL (ref 30.0–36.0)
MCV: 90.5 fl (ref 78.0–100.0)
MONO ABS: 0.5 10*3/uL (ref 0.1–1.0)
Monocytes Relative: 8.2 % (ref 3.0–12.0)
Neutro Abs: 4.1 10*3/uL (ref 1.4–7.7)
Neutrophils Relative %: 65.7 % (ref 43.0–77.0)
Platelets: 208 10*3/uL (ref 150.0–400.0)
RBC: 5.16 Mil/uL (ref 4.22–5.81)
RDW: 13.5 % (ref 11.5–15.5)
WBC: 6.2 10*3/uL (ref 4.0–10.5)

## 2017-06-14 LAB — PSA: PSA: 1.03 ng/mL (ref 0.10–4.00)

## 2017-06-14 LAB — LIPID PANEL
Cholesterol: 160 mg/dL (ref 0–200)
HDL: 71 mg/dL (ref >39.00)
LDL Cholesterol: 76 mg/dL (ref 0–99)
NONHDL: 89.05
Total CHOL/HDL Ratio: 2
Triglycerides: 67 mg/dL (ref 0.0–149.0)
VLDL: 13.4 mg/dL (ref 0.0–40.0)

## 2017-06-14 LAB — COMPREHENSIVE METABOLIC PANEL
ALBUMIN: 4.2 g/dL (ref 3.5–5.2)
ALK PHOS: 50 U/L (ref 39–117)
ALT: 15 U/L (ref 0–53)
AST: 16 U/L (ref 0–37)
BILIRUBIN TOTAL: 0.6 mg/dL (ref 0.2–1.2)
BUN: 18 mg/dL (ref 6–23)
CO2: 31 mEq/L (ref 19–32)
CREATININE: 0.85 mg/dL (ref 0.40–1.50)
Calcium: 9.6 mg/dL (ref 8.4–10.5)
Chloride: 105 mEq/L (ref 96–112)
GFR: 96.65 mL/min (ref >60.00)
Glucose, Bld: 88 mg/dL (ref 70–99)
POTASSIUM: 5.3 meq/L — AB (ref 3.5–5.1)
SODIUM: 144 meq/L (ref 135–145)
TOTAL PROTEIN: 6.5 g/dL (ref 6.0–8.3)

## 2017-06-14 LAB — TSH: TSH: 1.55 u[IU]/mL (ref 0.35–4.50)

## 2017-06-14 MED ORDER — VENLAFAXINE HCL ER 37.5 MG PO CP24: 37.5000 mg | ORAL_CAPSULE | Freq: Every day | ORAL | 3 refills | Status: DC

## 2017-06-14 NOTE — Patient Instructions (Signed)
GO TO THE LAB : Get the blood work     GO TO THE FRONT DESK Schedule your next appointment for a  routine checkup in 6 months  

## 2017-06-14 NOTE — Progress Notes (Signed)
Pre visit review using our clinic review tool, if applicable. No additional management support is needed unless otherwise documented below in the visit note. 

## 2017-06-14 NOTE — Assessment & Plan Note (Addendum)
-   Td 08, 05-2017 ; Pneumonia shot 2012; prevnar: 2016;  Flu shot: today shingrix discussed  -CCS: Cscope in 2004 for BRBPR >> hemorrhoids and tics Cscope again 03-2010, tics, had a polyp, benign, per pathology report recheck in 10 years (2021) -Prostate cancer screening: DRE 2015 --->asymmetric prostate , saw  Urology, after a examination they felt he has BPH . DRE 2017 asymmetric but at baseline, PSA was ok, likes it recheck, will do.  -  diet-exercise -Labs : CMP, FLP, CBC, TSH, PSA -Tobacco cessation: Stop smoking tobacco, vaping.  That is  a good decision, hopefully a stepping stone to quitting nicotine completely.

## 2017-06-14 NOTE — Progress Notes (Signed)
Subjective:    Patient ID: Curtis [name], male    DOB: May 21, 1956, 61 y.o.   MRN: [id number]  DOS:  06/14/2017 Type of visit - description : cpx Interval history: Good compliance with medications, no major concerns   Review of Systems  Aches and pains at baseline Stress still there, related to his son abusing alcohol.  He lives with them.  Other than above, a 14 point review of systems is negative    Past Medical History:  Diagnosis Date  . COPD (chronic obstructive pulmonary disease) ([place])   . Depression   . Multiple rib fractures 10/2016   R side after fall  . Neck pain, chronic   . Pneumothorax 10/2016   R side, after fall    Past Surgical History:  Procedure Laterality Date  . CHEST TUBE INSERTION Right 10/2016  . HERNIA REPAIR  2009   right  . KNEE SURGERY Right 1990s   arthroscopy    Social History   Socioeconomic History  . Marital status: Married    Spouse name: Not on file  . Number of children: 4  . Years of education: Not on file  . Highest education level: Not on file  Social Needs  . Financial resource strain: Not on file  . Food insecurity - worry: Not on file  . Food insecurity - inability: Not on file  . Transportation needs - medical: Not on file  . Transportation needs - non-medical: Not on file  Occupational History  . Occupation: IT    Employer: [employer]  Tobacco Use  . Smoking status: Current Some Day Smoker    Packs/day: 1.30    Years: 40.00    Pack years: 52.00    Types: Cigarettes  . Smokeless tobacco: Never Used  . Tobacco comment: quit cigarrets, vapes   Substance and Sexual Activity  . Alcohol use: Yes    Comment: socially   . Drug use: No  . Sexual activity: Not on file  Other Topics Concern  . Not on file  Social History Narrative   Lives w/ wife   4 children, 1 son h/o of abuse [name]) , he lives w/ them   1 son is a Software engineer                Family History  Problem Relation Age of Onset  . Cancer  Mother        skin  . Breast cancer Mother 32  . COPD Father   . Diabetes Father   . Stroke Father 21  . Alcohol abuse Son   . Colon cancer Neg Hx   . Prostate cancer Neg Hx   . CAD Neg Hx        MGF?     Allergies as of 06/14/2017   No Known Allergies     Medication List        Accurate as of 06/14/17 11:59 PM. Always use your most recent med list.          venlafaxine XR 37.5 MG 24 hr capsule Commonly known as:  EFFEXOR-XR Take 1 capsule (37.5 mg total) by mouth daily with breakfast.          Objective:   Physical Exam BP 116/68 (BP Location: Left Arm, Patient Position: Sitting, Cuff Size: Small)   Pulse 63   Temp 98 F (36.7 C) (Oral)   Resp 14   Ht 5\' 9"  (1.753 m)   Wt 168 lb 8 oz (76.4  kg)   SpO2 96%   BMI 24.88 kg/m  General:   Well developed, well nourished . NAD.  Neck: No  thyromegaly  HEENT:  Normocephalic . Face symmetric, atraumatic Lungs:  CTA B Normal respiratory effort, no intercostal retractions, no accessory muscle use. Heart: RRR,  no murmur.  No pretibial edema bilaterally  Abdomen:  Not distended, soft, non-tender. No rebound or rigidity.   Skin: Exposed areas without rash. Not pale. Not jaundice Neurologic:  alert & oriented X3.  Speech normal, gait appropriate for age and unassisted Strength symmetric and appropriate for age.  Psych: Cognition and judgment appear intact.  Cooperative with normal attention span and concentration.  Behavior appropriate. No anxious or depressed appearing.     Assessment & Plan:    Assessment  Depression - on effexor COPD, smoker. PFTs 11-2012 mild COPD Chronic neck pain --- saw Dr Mina Marble 2012 BPH   R PTX 10-2016, traumatic  PLAN Depression: Continue on Effexor, counseled today. COPD: He quit tobacco 10/2016, now vaping.  Although not perfect, that is a step in the right direction. Chronic neck pain: Pain at baseline, on and off, not problematic. BPH: No symptoms. RTC 6 months

## 2017-06-15 NOTE — Assessment & Plan Note (Signed)
Depression: Continue on Effexor, counseled today. COPD: He quit tobacco 10/2016, now vaping.  Although not perfect, that is a step in the right direction. Chronic neck pain: Pain at baseline, on and off, not problematic. BPH: No symptoms. RTC 6 months

## 2017-09-12 ENCOUNTER — Other Ambulatory Visit: Payer: Self-pay | Admitting: Internal Medicine

## 2017-12-13 ENCOUNTER — Encounter: Payer: Self-pay | Admitting: Internal Medicine

## 2017-12-13 ENCOUNTER — Ambulatory Visit: Payer: 59 | Admitting: Internal Medicine

## 2017-12-13 VITALS — BP 122/66 | HR 56 | Temp 97.8°F | Resp 14 | Ht 69.0 in | Wt 168.1 lb

## 2017-12-13 DIAGNOSIS — F32A Depression, unspecified: Secondary | ICD-10-CM

## 2017-12-13 DIAGNOSIS — E875 Hyperkalemia: Secondary | ICD-10-CM

## 2017-12-13 DIAGNOSIS — F329 Major depressive disorder, single episode, unspecified: Secondary | ICD-10-CM

## 2017-12-13 DIAGNOSIS — J410 Simple chronic bronchitis: Secondary | ICD-10-CM | POA: Diagnosis not present

## 2017-12-13 LAB — BASIC METABOLIC PANEL
BUN: 15 mg/dL (ref 6–23)
CHLORIDE: 105 meq/L (ref 96–112)
CO2: 30 mEq/L (ref 19–32)
CREATININE: 0.83 mg/dL (ref 0.40–1.50)
Calcium: 9.6 mg/dL (ref 8.4–10.5)
GFR: 99.18 mL/min (ref >60.00)
Glucose, Bld: 86 mg/dL (ref 70–99)
POTASSIUM: 4.7 meq/L (ref 3.5–5.1)
SODIUM: 141 meq/L (ref 135–145)

## 2017-12-13 NOTE — Progress Notes (Signed)
Pre visit review using our clinic review tool, if applicable. No additional management support is needed unless otherwise documented below in the visit note. 

## 2017-12-13 NOTE — Progress Notes (Signed)
Subjective:    Patient ID: Curtis [name], male    DOB: 1955/12/16, 62 y.o.   MRN: [id number]  DOS:  12/13/2017 Type of visit - description : ROV  Interval history: In general feeling well. On Effexor, symptoms controlled. K was elevated,  does not take any supplements or salt substitutes  Review of Systems  Insomnia still a issue, worries about his son, work-related issues.  Past Medical History:  Diagnosis Date  . COPD (chronic obstructive pulmonary disease) ([place])   . Depression   . Multiple rib fractures 10/2016   R side after fall  . Neck pain, chronic   . Pneumothorax 10/2016   R side, after fall    Past Surgical History:  Procedure Laterality Date  . CHEST TUBE INSERTION Right 10/2016  . HERNIA REPAIR  2009   right  . KNEE SURGERY Right 1990s   arthroscopy    Social History   Socioeconomic History  . Marital status: Married    Spouse name: Not on file  . Number of children: 4  . Years of education: Not on file  . Highest education level: Not on file  Occupational History  . Occupation: Building control surveyor: [employer]  . Financial resource strain: Not on file  . Food insecurity:    Worry: Not on file    Inability: Not on file  . Transportation needs:    Medical: Not on file    Non-medical: Not on file  Tobacco Use  . Smoking status: Current Some Day Smoker    Packs/day: 1.30    Years: 40.00    Pack years: 52.00    Types: Cigarettes  . Smokeless tobacco: Never Used  . Tobacco comment: quit cigarrets, vapes   Substance and Sexual Activity  . Alcohol use: Yes    Comment: socially   . Drug use: No  . Sexual activity: Not on file  Lifestyle  . Physical activity:    Days per week: Not on file    Minutes per session: Not on file  . Stress: Not on file  Relationships  . Social connections:    Talks on phone: Not on file    Gets together: Not on file    Attends religious service: Not on file    Active member of club or  organization: Not on file    Attends meetings of clubs or organizations: Not on file    Relationship status: Not on file  . Intimate partner violence:    Fear of current or ex partner: Not on file    Emotionally abused: Not on file    Physically abused: Not on file    Forced sexual activity: Not on file  Other Topics Concern  . Not on file  Social History Narrative   Lives w/ wife   4 children, 1 son h/o of abuse [name]) , he lives w/ them   1 son is a Software engineer                 Allergies as of 12/13/2017   No Known Allergies     Medication List        Accurate as of 12/13/17 11:59 PM. Always use your most recent med list.          venlafaxine XR 37.5 MG 24 hr capsule Commonly known as:  EFFEXOR-XR TAKE 1 CAPSULE(37.5 MG) BY MOUTH DAILY WITH BREAKFAST          Objective:  Physical Exam BP 122/66 (BP Location: Left Arm, Patient Position: Sitting, Cuff Size: Small)   Pulse (!) 56   Temp 97.8 F (36.6 C) (Oral)   Resp 14   Ht 5\' 9"  (1.753 m)   Wt 168 lb 2 oz (76.3 kg)   SpO2 98%   BMI 24.83 kg/m  General:   Well developed, NAD, see BMI.  HEENT:  Normocephalic . Face symmetric, atraumatic Lungs: Clear to auscultation bilaterally Cardiovascular regular rate and rhythm, no murmur Skin: Not pale. Not jaundice Neurologic:  alert & oriented X3.  Speech normal, gait appropriate for age and unassisted Psych--  Cognition and judgment appear intact.  Cooperative with normal attention span and concentration.  Behavior appropriate. No anxious or depressed appearing.      Assessment & Plan:   Assessment  Depression - on effexor COPD, smoker. PFTs 11-2012 mild COPD Chronic neck pain --- saw Dr Mina Marble 2012 BPH   R PTX 10-2016, traumatic  PLAN Depression: On Effexor, a lot of  stress related mostly to his son's behavior, he has a issue with alcohol.  He was extensively counseled that. COPD: Asymptomatic, still vaping. Hyperkalemia: Unclear why, not taking any  salt substitutes or potassium supplements, recheck today RTC 6 months, CPX

## 2017-12-13 NOTE — Patient Instructions (Signed)
GO TO THE LAB : Get the blood work     GO TO THE FRONT DESK Schedule your next appointment for a  Physical exam in 6 months   

## 2017-12-14 NOTE — Assessment & Plan Note (Signed)
Depression: On Effexor, a lot of  stress related mostly to his son's behavior, he has a issue with alcohol.  He was extensively counseled that. COPD: Asymptomatic, still vaping. Hyperkalemia: Unclear why, not taking any salt substitutes or potassium supplements, recheck today RTC 6 months, CPX

## 2018-03-06 ENCOUNTER — Other Ambulatory Visit: Payer: Self-pay | Admitting: Infectious Diseases

## 2018-03-06 MED ORDER — PREDNISONE 10 MG (21) PO TBPK: ORAL_TABLET | ORAL | 0 refills | Status: DC

## 2018-03-06 MED ORDER — TRIAMCINOLONE ACETONIDE 0.1 % EX LOTN: 1.0000 "application " | TOPICAL_LOTION | Freq: Three times a day (TID) | CUTANEOUS | 1 refills | Status: DC

## 2018-03-31 ENCOUNTER — Other Ambulatory Visit: Payer: Self-pay | Admitting: Internal Medicine

## 2018-03-31 MED ORDER — VENLAFAXINE HCL ER 37.5 MG PO CP24: 37.5000 mg | ORAL_CAPSULE | Freq: Every day | ORAL | 1 refills | Status: DC

## 2018-05-29 DIAGNOSIS — Z23 Encounter for immunization: Secondary | ICD-10-CM | POA: Diagnosis not present

## 2018-06-15 ENCOUNTER — Ambulatory Visit (INDEPENDENT_AMBULATORY_CARE_PROVIDER_SITE_OTHER): Payer: 59 | Admitting: Internal Medicine

## 2018-06-15 ENCOUNTER — Encounter: Payer: Self-pay | Admitting: Internal Medicine

## 2018-06-15 VITALS — BP 126/68 | HR 60 | Temp 98.3°F | Resp 16 | Ht 69.0 in | Wt 174.5 lb

## 2018-06-15 DIAGNOSIS — Z0000 Encounter for general adult medical examination without abnormal findings: Secondary | ICD-10-CM | POA: Diagnosis not present

## 2018-06-15 LAB — COMPREHENSIVE METABOLIC PANEL
ALT: 10 U/L (ref 0–53)
AST: 14 U/L (ref 0–37)
Albumin: 4.4 g/dL (ref 3.5–5.2)
Alkaline Phosphatase: 50 U/L (ref 39–117)
BUN: 13 mg/dL (ref 6–23)
CO2: 31 meq/L (ref 19–32)
Calcium: 9.9 mg/dL (ref 8.4–10.5)
Chloride: 103 mEq/L (ref 96–112)
Creatinine, Ser: 0.85 mg/dL (ref 0.40–1.50)
GFR: 96.34 mL/min (ref >60.00)
Glucose, Bld: 95 mg/dL (ref 70–99)
Potassium: 4.9 mEq/L (ref 3.5–5.1)
Sodium: 140 mEq/L (ref 135–145)
Total Bilirubin: 0.6 mg/dL (ref 0.2–1.2)
Total Protein: 6.5 g/dL (ref 6.0–8.3)

## 2018-06-15 LAB — CBC WITH DIFFERENTIAL/PLATELET
Basophils Absolute: 0.1 10*3/uL (ref 0.0–0.1)
Basophils Relative: 0.7 % (ref 0.0–3.0)
Eosinophils Absolute: 0.1 10*3/uL (ref 0.0–0.7)
Eosinophils Relative: 1.6 % (ref 0.0–5.0)
HCT: 46.8 % (ref 39.0–52.0)
Hemoglobin: 15.8 g/dL (ref 13.0–17.0)
LYMPHS PCT: 19.7 % (ref 12.0–46.0)
Lymphs Abs: 1.4 10*3/uL (ref 0.7–4.0)
MCHC: 33.8 g/dL (ref 30.0–36.0)
MCV: 87.9 fl (ref 78.0–100.0)
Monocytes Absolute: 0.6 10*3/uL (ref 0.1–1.0)
Monocytes Relative: 8 % (ref 3.0–12.0)
NEUTROS ABS: 5 10*3/uL (ref 1.4–7.7)
Neutrophils Relative %: 70 % (ref 43.0–77.0)
Platelets: 216 10*3/uL (ref 150.0–400.0)
RBC: 5.32 Mil/uL (ref 4.22–5.81)
RDW: 13.5 % (ref 11.5–15.5)
WBC: 7.1 10*3/uL (ref 4.0–10.5)

## 2018-06-15 LAB — LIPID PANEL
Cholesterol: 174 mg/dL (ref 0–200)
HDL: 71.9 mg/dL (ref >39.00)
LDL CALC: 89 mg/dL (ref 0–99)
NonHDL: 102.19
TRIGLYCERIDES: 64 mg/dL (ref 0.0–149.0)
Total CHOL/HDL Ratio: 2
VLDL: 12.8 mg/dL (ref 0.0–40.0)

## 2018-06-15 LAB — PSA: PSA: 1.63 ng/mL (ref 0.10–4.00)

## 2018-06-15 MED ORDER — ZOSTER VAC RECOMB ADJUVANTED 50 MCG/0.5ML IM SUSR: 0.5000 mL | Freq: Once | INTRAMUSCULAR | 1 refills | Status: AC

## 2018-06-15 NOTE — Progress Notes (Signed)
Pre visit review using our clinic review tool, if applicable. No additional management support is needed unless otherwise documented below in the visit note. 

## 2018-06-15 NOTE — Assessment & Plan Note (Addendum)
-   Td  05-2017 ; Pneumonia shot 2012; prevnar: 2016;  Had a Flu shot; shingrix discussed, likes to proceed but is not available today, rx printed   -CCS: Cscope in 2004 for BRBPR >> hemorrhoids and tics Cscope again 03-2010, tics, had a polyp, benign, per pathology report recheck in 10 years (2021) -Prostate cancer screening: DRE 2015 --->asymmetric prostate , saw  Urology, after a examination they felt he has BPH . DRE today seems symmetric, check a PSA  -  diet-exercise discussed  -Labs : CMP, FLP, CBC, PSA -Tobacco: Smoke 1 PPD  from age 71 to 62; now vaping.  We talk about pros and cons of CT lung cancer screening, declined for now. Patient is aware that vaping is not safe.

## 2018-06-15 NOTE — Patient Instructions (Signed)
GO TO THE LAB : Get the blood work     GO TO THE FRONT DESK Schedule your next appointment   for a physical exam in 1 year 

## 2018-06-15 NOTE — Progress Notes (Signed)
Subjective:    Patient ID: Curtis [name], male    DOB: 1955-10-02, 62 y.o.   MRN: [id number]  DOS:  06/15/2018 Type of visit - description : CPX In general feeling well.  Still having issues with his son who has behavioral and etoh  problems.   Review of Systems  Other than above, a 14 point review of systems is negative    Past Medical History:  Diagnosis Date  . COPD (chronic obstructive pulmonary disease) ([place])   . Depression   . Multiple rib fractures 10/2016   R side after fall  . Neck pain, chronic   . Pneumothorax 10/2016   R side, after fall    Past Surgical History:  Procedure Laterality Date  . CHEST TUBE INSERTION Right 10/2016  . HERNIA REPAIR  2009   right  . KNEE SURGERY Right 1990s   arthroscopy    Social History   Socioeconomic History  . Marital status: Married    Spouse name: Cecille Rubin  . Number of children: 4  . Years of education: Not on file  . Highest education level: Not on file  Occupational History  . Occupation: Building control surveyor: [employer]  . Financial resource strain: Not on file  . Food insecurity:    Worry: Not on file    Inability: Not on file  . Transportation needs:    Medical: Not on file    Non-medical: Not on file  Tobacco Use  . Smoking status: Former Smoker    Packs/day: 1.30    Years: 40.00    Pack years: 52.00    Types: Cigarettes, E-cigarettes  . Smokeless tobacco: Never Used  . Tobacco comment: quit cigarrets, vapes since ~ 05/2017  Substance and Sexual Activity  . Alcohol use: Yes    Comment: socially   . Drug use: No  . Sexual activity: Not on file  Lifestyle  . Physical activity:    Days per week: Not on file    Minutes per session: Not on file  . Stress: Not on file  Relationships  . Social connections:    Talks on phone: Not on file    Gets together: Not on file    Attends religious service: Not on file    Active member of club or organization: Not on file    Attends meetings of  clubs or organizations: Not on file    Relationship status: Not on file  . Intimate partner violence:    Fear of current or ex partner: Not on file    Emotionally abused: Not on file    Physically abused: Not on file    Forced sexual activity: Not on file  Other Topics Concern  . Not on file  Social History Narrative   Lives w/ wife   4 children   --1 son h/o of abuse [name]) , he lives w/ them   --1 son is a Software engineer    --Daughter Colletta [place], NP for ID, 2 children   --Daughter [name], 1 child               Family History  Problem Relation Age of Onset  . Cancer Mother        skin  . Breast cancer Mother 70  . COPD Father   . Diabetes Father   . Stroke Father 26  . Alcohol abuse Son   . Colon cancer Neg Hx   . Prostate cancer Neg Hx   .  CAD Neg Hx        MGF?     Allergies as of 06/15/2018   No Known Allergies     Medication List       Accurate as of June 15, 2018  6:03 PM. Always use your most recent med list.        venlafaxine XR 37.5 MG 24 hr capsule Commonly known as:  EFFEXOR-XR Take 1 capsule (37.5 mg total) by mouth daily with breakfast.   Zoster Vaccine Adjuvanted injection Commonly known as:  SHINGRIX Inject 0.5 mLs into the muscle once for 1 dose.           Objective:   Physical Exam BP 126/68 (BP Location: Left Arm, Patient Position: Sitting, Cuff Size: Small)   Pulse 60   Temp 98.3 F (36.8 C) (Oral)   Resp 16   Ht 5\' 9"  (1.753 m)   Wt 174 lb 8 oz (79.2 kg)   SpO2 98%   BMI 25.77 kg/m  General: Well developed, NAD, BMI noted Neck: No  thyromegaly  HEENT:  Normocephalic . Face symmetric, atraumatic Lungs:  CTA B Normal respiratory effort, no intercostal retractions, no accessory muscle use. Heart: RRR,  no murmur.  No pretibial edema bilaterally  Abdomen:  Not distended, soft, non-tender. No rebound or rigidity.   Skin: Exposed areas without rash. Not pale. Not jaundice Prostate exam: DRE essentially normal,  prostate is a slightly enlarged but symmetric and nontender.  Brown stools Neurologic:  alert & oriented X3.  Speech normal, gait appropriate for age and unassisted Strength symmetric and appropriate for age.  Psych: Cognition and judgment appear intact.  Cooperative with normal attention span and concentration.  Behavior appropriate. No anxious or depressed appearing.     Assessment & Plan:    Assessment  Depression - on effexor COPD, smoker. PFTs 11-2012 mild COPD Chronic neck pain --- saw Dr Mina Marble 2012 BPH   R PTX 10-2016, traumatic  PLAN Depression: On Effexor, PHQ 9 scored 4, feeling well emotionally.  Counseled.  No change. COPD: Essentially asymptomatic.  He is now vaping and not smoking. RTC 1 year

## 2018-06-15 NOTE — Assessment & Plan Note (Signed)
  Depression: On Effexor, PHQ 9 scored 4, feeling well emotionally.  Counseled.  No change. COPD: Essentially asymptomatic.  He is now vaping and not smoking. RTC 1 year

## 2018-09-03 ENCOUNTER — Other Ambulatory Visit: Payer: Self-pay | Admitting: Internal Medicine

## 2018-12-19 IMAGING — CR DG CHEST 1V PORT
1 series · 1 of 1 positions shown · non-contrast
Comparison: 10/29/2016

CLINICAL DATA: Multiple rib fractures, right pneumothorax

EXAM:
PORTABLE CHEST 1 VIEW

[AP]
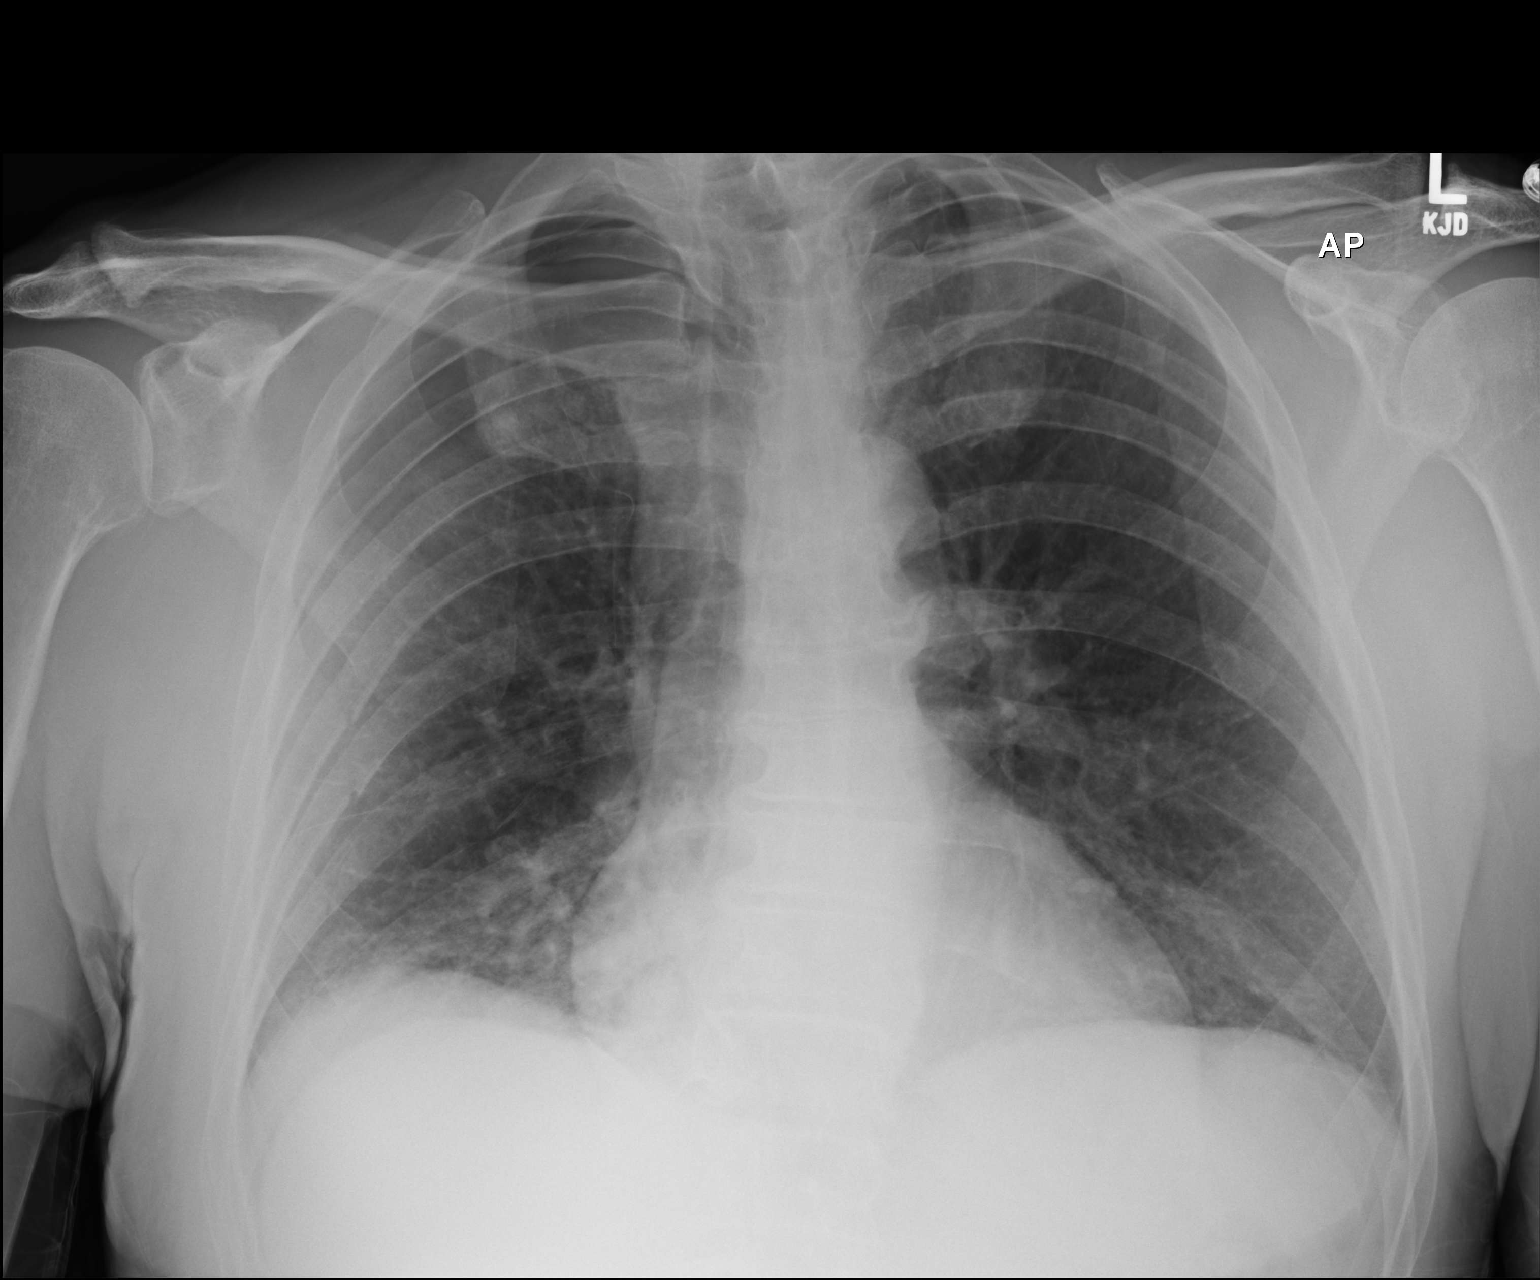

[1 of 1 positions shown; findings below may reference images not displayed]

FINDINGS: Increasing atelectasis at both lung bases. Stable cardiomediastinal
silhouette.

Multiple right mildly displaced rib fractures. Moderate right apical
pneumothorax, probably stable without midline shift, the lateral
pleural line is very difficult to see.
IMPRESSION: 1. Moderate right apical pneumothorax, does not appear significantly
increased in size, no midline shift. The pleural line laterally is
very difficult to see.
2. Increasing atelectasis at both lung bases.
3. Multiple right rib fractures.

## 2018-12-20 IMAGING — DX DG CHEST 1V PORT
1 series · 1 of 1 positions shown · non-contrast
Comparison: 10/30/2016 and earlier.

ADDENDUM:
Study discussed by telephone with ANGELEE TOH on 10/31/2016
at 0151 hours.
CLINICAL DATA: 60-year-old male status post fall yesterday with rib
fractures. Right pneumothorax.

EXAM:
PORTABLE CHEST 1 VIEW

[chest ap]
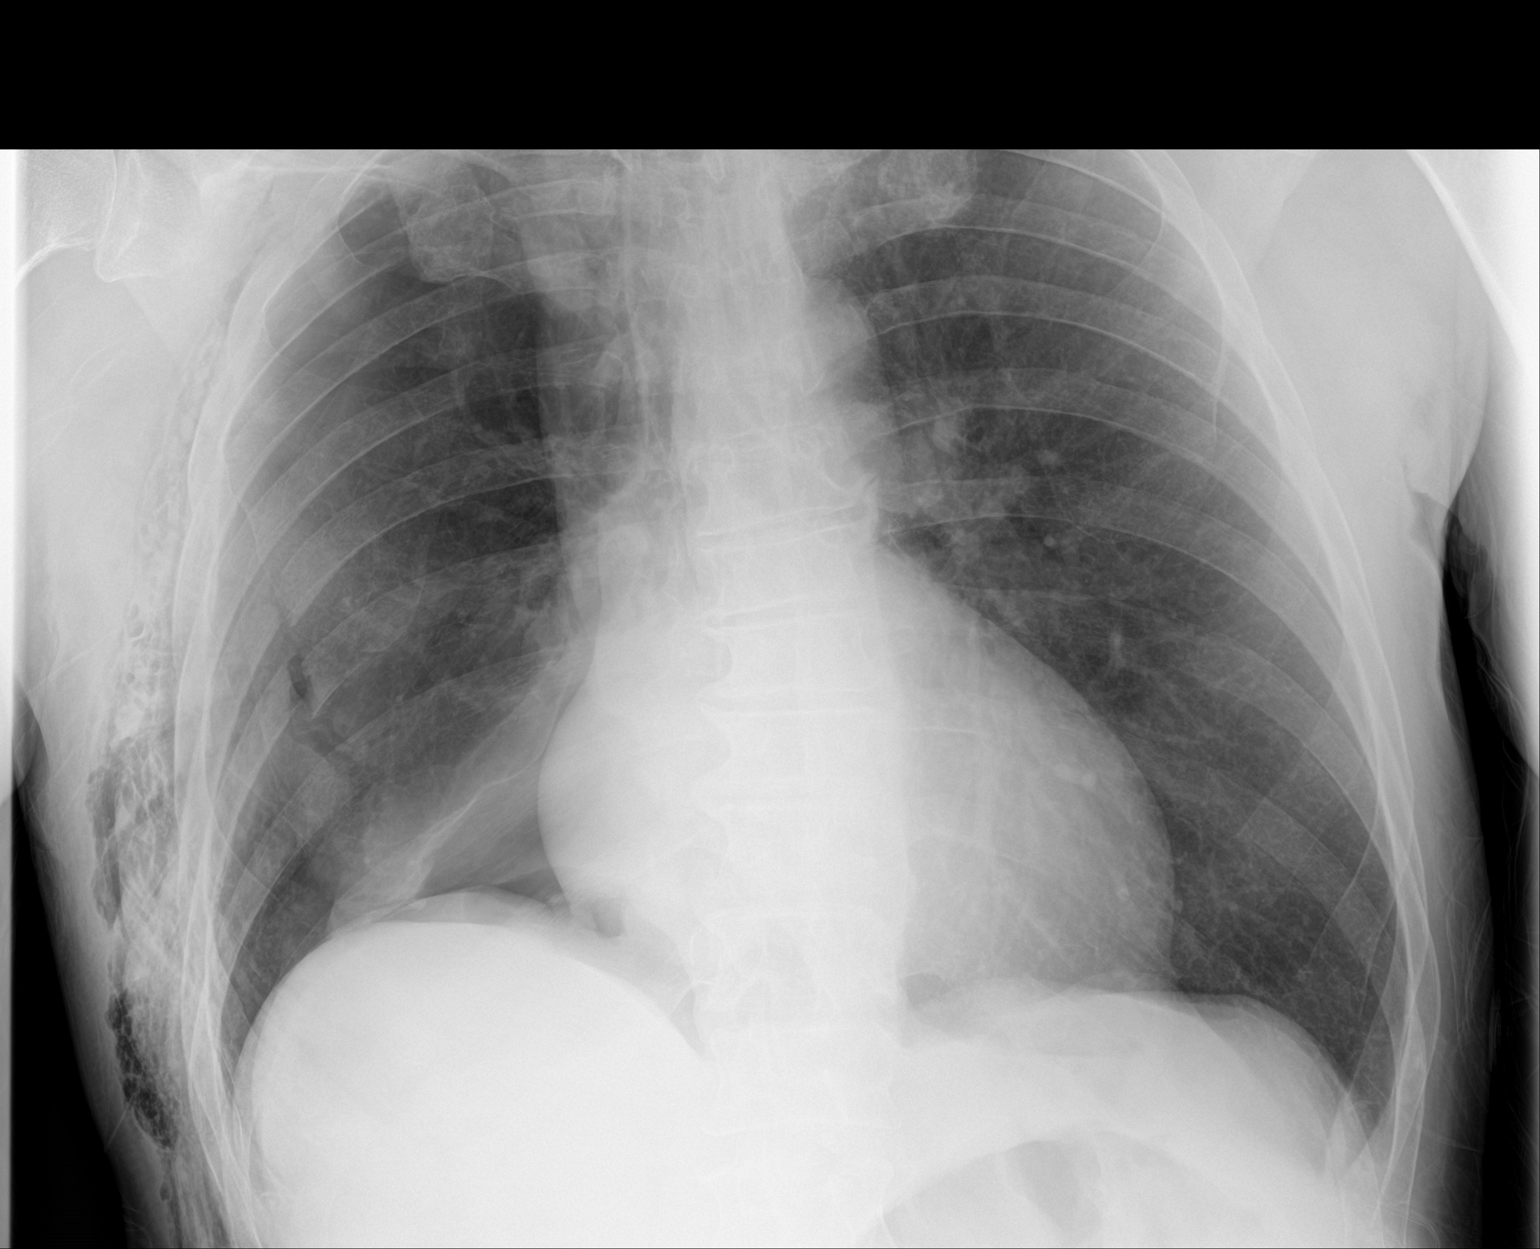

[1 of 1 positions shown; findings below may reference images not displayed]

FINDINGS: Portable AP semi upright view at 4479 hours. Enlarging anterior
pneumothorax, now most apparent in the right middle lobe region with
associated increased middle lobe atelectasis. Increasing and
moderate right chest wall and supraclavicular subcutaneous
emphysema.

Multiple displaced left posterolateral rib fractures. No mediastinal
shift. Mediastinal contours remain within normal limits. The left
lung remains clear. Visible bowel gas pattern within normal limits.
IMPRESSION: 1. Increasing right pneumothorax with air leak and right body wall
subcutaneous emphysema. No tension component at this time.
2. Increased right lung atelectasis.  Displaced right rib fractures.
3. Negative left lung.

## 2018-12-21 IMAGING — DX DG CHEST 1V PORT
1 series · 1 of 1 positions shown · non-contrast
Comparison: Portable chest x-ray October 31, 2016

CLINICAL DATA: Follow-up right-sided pneumothorax with chest tube
treatment. History of COPD, multiple rib fractures, smoker

EXAM:
PORTABLE CHEST 1 VIEW

[chest ap]
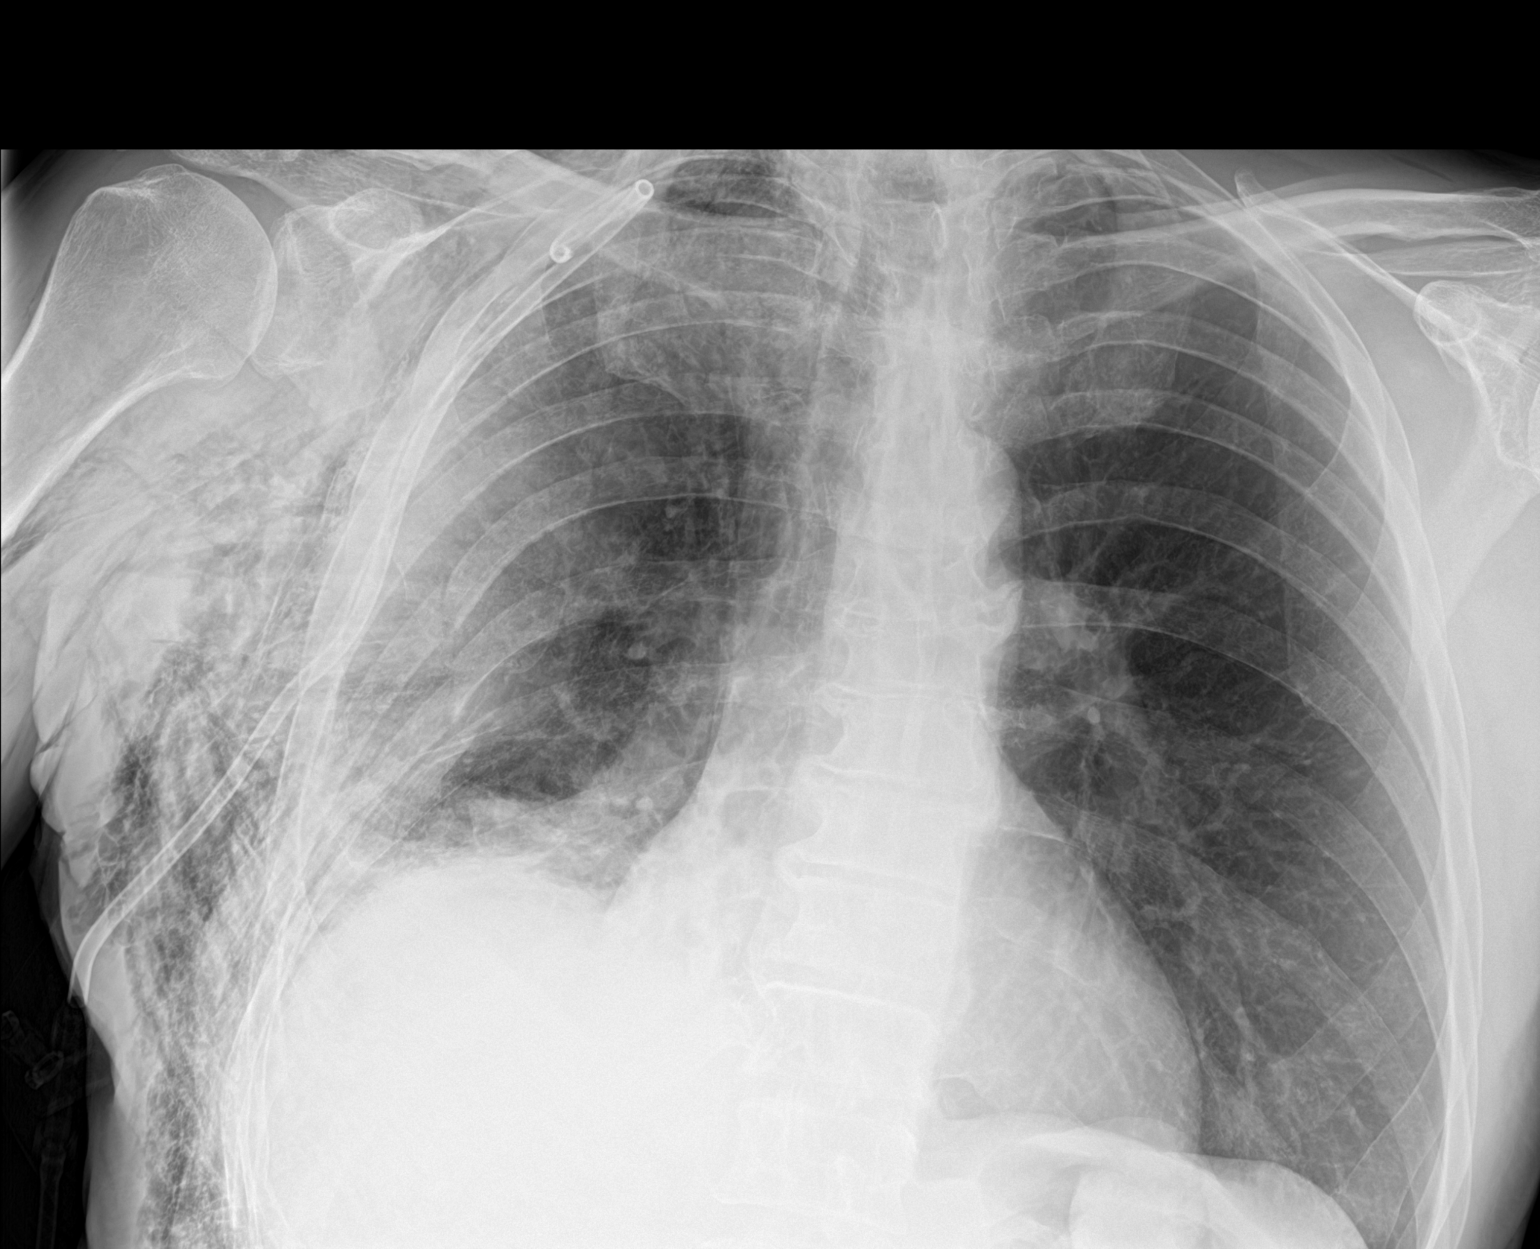

[1 of 1 positions shown; findings below may reference images not displayed]

FINDINGS: The right lung volume remains low. Multiple right rib fractures are
visible. No definite pneumothorax is present. Increased density just
above the right hemidiaphragm. There is a large amount of overlying
subcutaneous emphysema in the right axillary region extending into
the right neck. The small caliber chest tube is in stable position
in the pulmonary apex. There is no mediastinal shift. The left lung
is clear. The heart and pulmonary vascularity are normal.
IMPRESSION: No pneumothorax is visible but there is a large amount of
subcutaneous emphysema similar to that seen yesterday stable
position of the small caliber right chest tube. No mediastinal
shift. No CHF. Minimal right basilar atelectasis.

Multiple right-sided rib fractures.

## 2019-06-19 ENCOUNTER — Other Ambulatory Visit: Payer: Self-pay

## 2019-06-20 ENCOUNTER — Ambulatory Visit (INDEPENDENT_AMBULATORY_CARE_PROVIDER_SITE_OTHER): Payer: 59 | Admitting: Internal Medicine

## 2019-06-20 ENCOUNTER — Other Ambulatory Visit: Payer: Self-pay

## 2019-06-20 ENCOUNTER — Encounter: Payer: Self-pay | Admitting: Internal Medicine

## 2019-06-20 VITALS — BP 131/79 | HR 69 | Temp 97.1°F | Resp 16 | Ht 69.0 in | Wt 171.4 lb

## 2019-06-20 DIAGNOSIS — Z0000 Encounter for general adult medical examination without abnormal findings: Secondary | ICD-10-CM | POA: Diagnosis not present

## 2019-06-20 DIAGNOSIS — Z23 Encounter for immunization: Secondary | ICD-10-CM | POA: Diagnosis not present

## 2019-06-20 DIAGNOSIS — Z87891 Personal history of nicotine dependence: Secondary | ICD-10-CM

## 2019-06-20 DIAGNOSIS — Z122 Encounter for screening for malignant neoplasm of respiratory organs: Secondary | ICD-10-CM

## 2019-06-20 LAB — CBC WITH DIFFERENTIAL/PLATELET
Basophils Absolute: 0 10*3/uL (ref 0.0–0.1)
Basophils Relative: 0.5 % (ref 0.0–3.0)
Eosinophils Absolute: 0.1 10*3/uL (ref 0.0–0.7)
Eosinophils Relative: 1 % (ref 0.0–5.0)
HCT: 46.1 % (ref 39.0–52.0)
Hemoglobin: 15.3 g/dL (ref 13.0–17.0)
Lymphocytes Relative: 16.5 % (ref 12.0–46.0)
Lymphs Abs: 1.4 10*3/uL (ref 0.7–4.0)
MCHC: 33.2 g/dL (ref 30.0–36.0)
MCV: 89.1 fl (ref 78.0–100.0)
Monocytes Absolute: 0.7 10*3/uL (ref 0.1–1.0)
Monocytes Relative: 8.3 % (ref 3.0–12.0)
Neutro Abs: 6.1 10*3/uL (ref 1.4–7.7)
Neutrophils Relative %: 73.7 % (ref 43.0–77.0)
Platelets: 210 10*3/uL (ref 150.0–400.0)
RBC: 5.17 Mil/uL (ref 4.22–5.81)
RDW: 14 % (ref 11.5–15.5)
WBC: 8.3 10*3/uL (ref 4.0–10.5)

## 2019-06-20 LAB — COMPREHENSIVE METABOLIC PANEL
ALT: 17 U/L (ref 0–53)
AST: 18 U/L (ref 0–37)
Albumin: 4.3 g/dL (ref 3.5–5.2)
Alkaline Phosphatase: 56 U/L (ref 39–117)
BUN: 14 mg/dL (ref 6–23)
CO2: 29 mEq/L (ref 19–32)
Calcium: 9.5 mg/dL (ref 8.4–10.5)
Chloride: 104 mEq/L (ref 96–112)
Creatinine, Ser: 0.86 mg/dL (ref 0.40–1.50)
GFR: 89.15 mL/min (ref >60.00)
Glucose, Bld: 89 mg/dL (ref 70–99)
Potassium: 4.8 mEq/L (ref 3.5–5.1)
Sodium: 140 mEq/L (ref 135–145)
Total Bilirubin: 0.7 mg/dL (ref 0.2–1.2)
Total Protein: 6.6 g/dL (ref 6.0–8.3)

## 2019-06-20 LAB — LIPID PANEL
Cholesterol: 202 mg/dL — ABNORMAL HIGH (ref 0–200)
HDL: 77.2 mg/dL (ref >39.00)
LDL Cholesterol: 113 mg/dL — ABNORMAL HIGH (ref 0–99)
NonHDL: 125.18
Total CHOL/HDL Ratio: 3
Triglycerides: 60 mg/dL (ref 0.0–149.0)
VLDL: 12 mg/dL (ref 0.0–40.0)

## 2019-06-20 MED ORDER — SHINGRIX 50 MCG/0.5ML IM SUSR: 0.5000 mL | Freq: Once | INTRAMUSCULAR | 1 refills | Status: AC

## 2019-06-20 MED ORDER — VENLAFAXINE HCL ER 37.5 MG PO CP24: 37.5000 mg | ORAL_CAPSULE | Freq: Every day | ORAL | 3 refills | Status: DC

## 2019-06-20 NOTE — Patient Instructions (Addendum)
GO TO THE LAB : Get the blood work     GO TO THE FRONT DESK Schedule your next appointment for a physical exam in 1 year  We are ordering a CAT scan of your chest

## 2019-06-20 NOTE — Progress Notes (Signed)
Subjective:    Patient ID: Curtis [name], male    DOB: 11-25-1955, 63 y.o.   MRN: [id number]  DOS:  06/20/2019 Type of visit - description: CPX Since the last office visit no major events.  Good compliance with medication, in general feels well.   Review of Systems Some stress but managing well.  Other than above, a 14 point review of systems is negative    Past Medical History:  Diagnosis Date  . COPD (chronic obstructive pulmonary disease) ([place])   . Depression   . Multiple rib fractures 10/2016   R side after fall  . Neck pain, chronic   . Pneumothorax 10/2016   R side, after fall    Past Surgical History:  Procedure Laterality Date  . CHEST TUBE INSERTION Right 10/2016  . HERNIA REPAIR  2009   right  . KNEE SURGERY Right 1990s   arthroscopy    Social History   Socioeconomic History  . Marital status: Married    Spouse name: Cecille Rubin  . Number of children: 4  . Years of education: Not on file  . Highest education level: Not on file  Occupational History  . Occupation: IT    Employer: [employer]  Tobacco Use  . Smoking status: Former Smoker    Packs/day: 1.30    Years: 40.00    Pack years: 52.00    Types: Cigarettes, E-cigarettes  . Smokeless tobacco: Never Used  . Tobacco comment: quit cigarrets, vapes since ~ 05/2017  Substance and Sexual Activity  . Alcohol use: Yes    Comment: socially   . Drug use: No  . Sexual activity: Not on file  Other Topics Concern  . Not on file  Social History Narrative   Lives w/ wife   4 children   --1 son h/o of substance  abuse [name]) , he lives w/ them   --1 son is a Software engineer    --Daughter Colletta [place], NP for ID, 2 children   --Daughter [name], 1 child             Social Determinants of Health   Financial Resource Strain:   . Difficulty of Paying Living Expenses: Not on file  Food Insecurity:   . Worried About Charity fundraiser in the Last Year: Not on file  . Ran Out of Food in the Last Year: Not  on file  Transportation Needs:   . Lack of Transportation (Medical): Not on file  . Lack of Transportation (Non-Medical): Not on file  Physical Activity:   . Days of Exercise per Week: Not on file  . Minutes of Exercise per Session: Not on file  Stress:   . Feeling of Stress : Not on file  Social Connections:   . Frequency of Communication with Friends and Family: Not on file  . Frequency of Social Gatherings with Friends and Family: Not on file  . Attends Religious Services: Not on file  . Active Member of Clubs or Organizations: Not on file  . Attends Archivist Meetings: Not on file  . Marital Status: Not on file  Intimate Partner Violence:   . Fear of Current or Ex-Partner: Not on file  . Emotionally Abused: Not on file  . Physically Abused: Not on file  . Sexually Abused: Not on file     Family History  Problem Relation Age of Onset  . Cancer Mother        skin  . Breast cancer Mother 18  .  COPD Father   . Diabetes Father   . Stroke Father 45  . Alcohol abuse Son   . Colon cancer Neg Hx   . Prostate cancer Neg Hx   . CAD Neg Hx        MGF?     Allergies as of 06/20/2019   No Known Allergies     Medication List       Accurate as of June 20, 2019 11:59 PM. If you have any questions, ask your nurse or doctor.        Shingrix injection Generic drug: Zoster Vaccine Adjuvanted Inject 0.5 mLs into the muscle once for 1 dose. Started by: Kathlene November, MD   venlafaxine XR 37.5 MG 24 hr capsule Commonly known as: EFFEXOR-XR Take 1 capsule (37.5 mg total) by mouth daily with breakfast.           Objective:   Physical Exam BP 131/79 (BP Location: Left Arm, Patient Position: Sitting, Cuff Size: Small)   Pulse 69   Temp (!) 97.1 F (36.2 C) (Temporal)   Resp 16   Ht 5\' 9"  (1.753 m)   Wt 171 lb 6 oz (77.7 kg)   SpO2 100%   BMI 25.31 kg/m   General: Well developed, NAD, BMI noted Neck: No  thyromegaly  HEENT:  Normocephalic . Face  symmetric, atraumatic Lungs:  CTA B Normal respiratory effort, no intercostal retractions, no accessory muscle use. Heart: RRR,  no murmur.  No pretibial edema bilaterally  Abdomen:  Not distended, soft, non-tender. No rebound or rigidity.   Skin: Exposed areas without rash. Not pale. Not jaundice Neurologic:  alert & oriented X3.  Speech normal, gait appropriate for age and unassisted Strength symmetric and appropriate for age.  Psych: Cognition and judgment appear intact.  Cooperative with normal attention span and concentration.  Behavior appropriate. No anxious or depressed appearing.     Assessment     Assessment  Depression - on effexor COPD, smoker. PFTs 11-2012 mild COPD Chronic neck pain --- saw Dr Mina Marble 2012 BPH   R PTX 10-2016, traumatic  PLAN Here for CPX Depression: Well-controlled COPD smoker: He does not smoke tobacco but continues vaping heavily.  Recommend possibly try a patch to decrease his heavy vaping with a goal to stop vaping in the future. Also Wellbutrin and Chantix are options. History of COPD: Essentially no symptoms at this point Chronic neck pain: Mild, managed with OTCs as needed RTC 1 year    This visit occurred during the SARS-CoV-2 public health emergency.  Safety protocols were in place, including screening questions prior to the visit, additional usage of staff PPE, and extensive cleaning of exam room while observing appropriate contact time as indicated for disinfecting solutions.

## 2019-06-20 NOTE — Progress Notes (Signed)
Pre visit review using our clinic review tool, if applicable. No additional management support is needed unless otherwise documented below in the visit note. 

## 2019-06-21 NOTE — Assessment & Plan Note (Signed)
-   Td  05-2017  - Pneumonia shot 2012; prevnar: 2016 - shinreg rx reprinted today  - Flu shot today -CCS: Cscope in 2004 for BRBPR >> hemorrhoids and tics Cscope again 03-2010, tics, had a polyp, benign, per pathology report recheck in 10 years (2021) -Prostate cancer screening: DRE 2015 --->asymmetric prostate , saw  Urology, dx BPH . DRE - PSA wnl 2019  -  diet-exercise discussed  -Labs : CMP, FLP, CBC -Tobacco-nicotine: See comments under assessment and plan. Smokeed  1 PPD  from age 70 to 41; now vaping.    We agreed on proceeding with CT chest for scanning for lung cancer.

## 2019-06-21 NOTE — Assessment & Plan Note (Signed)
Here for CPX Depression: Well-controlled COPD smoker: He does not smoke tobacco but continues vaping heavily.  Recommend possibly try a patch to decrease his heavy vaping with a goal to stop vaping in the future. Also Wellbutrin and Chantix are options. History of COPD: Essentially no symptoms at this point Chronic neck pain: Mild, managed with OTCs as needed RTC 1 year

## 2019-06-27 ENCOUNTER — Ambulatory Visit: Payer: 59 | Attending: Internal Medicine

## 2019-06-27 DIAGNOSIS — Z20822 Contact with and (suspected) exposure to covid-19: Secondary | ICD-10-CM

## 2019-06-28 LAB — NOVEL CORONAVIRUS, NAA: SARS-CoV-2, NAA: NOT DETECTED

## 2019-10-04 ENCOUNTER — Other Ambulatory Visit: Payer: Self-pay | Admitting: Internal Medicine

## 2020-05-09 ENCOUNTER — Encounter: Payer: Self-pay | Admitting: Internal Medicine

## 2020-06-23 ENCOUNTER — Encounter: Payer: Self-pay | Admitting: Internal Medicine

## 2020-06-23 ENCOUNTER — Encounter: Payer: 59 | Admitting: Internal Medicine

## 2020-06-23 DIAGNOSIS — Z0289 Encounter for other administrative examinations: Secondary | ICD-10-CM

## 2020-06-27 ENCOUNTER — Other Ambulatory Visit: Payer: Self-pay | Admitting: Internal Medicine

## 2020-06-29 ENCOUNTER — Encounter: Payer: Self-pay | Admitting: Internal Medicine

## 2020-06-29 ENCOUNTER — Other Ambulatory Visit: Payer: Self-pay | Admitting: Physician Assistant

## 2020-06-29 DIAGNOSIS — J449 Chronic obstructive pulmonary disease, unspecified: Secondary | ICD-10-CM

## 2020-06-29 DIAGNOSIS — Z6825 Body mass index (BMI) 25.0-25.9, adult: Secondary | ICD-10-CM

## 2020-06-29 DIAGNOSIS — J410 Simple chronic bronchitis: Secondary | ICD-10-CM

## 2020-06-29 DIAGNOSIS — U071 COVID-19: Secondary | ICD-10-CM

## 2020-06-29 NOTE — Progress Notes (Signed)
I connected by phone with Curtis [name] on 06/29/2020 at 12:07 PM to discuss the potential use of a new treatment for mild to moderate COVID-19 viral infection in non-hospitalized patients.  This patient is a 65 y.o. male that meets the FDA criteria for Emergency Use Authorization of COVID monoclonal antibody sotrovimab, casirivimab/imdevimab or bamlamivimab/estevimab.  Has a (+) direct SARS-CoV-2 viral test result  Has mild or moderate COVID-19   Is NOT hospitalized due to COVID-19  Is within 10 days of symptom onset  Has at least one of the high risk factor(s) for progression to severe COVID-19 and/or hospitalization as defined in EUA.  Specific high risk criteria : Older age (>/= 67 yo), BMI > 25, Chronic Lung Disease and Other high risk medical condition per CDC:  unvaccinated    I have spoken and communicated the following to the patient or parent/caregiver regarding COVID monoclonal antibody treatment:  1. FDA has authorized the emergency use for the treatment of mild to moderate COVID-19 in adults and pediatric patients with positive results of direct SARS-CoV-2 viral testing who are 29 years of age and older weighing at least 40 kg, and who are at high risk for progressing to severe COVID-19 and/or hospitalization.  2. The significant known and potential risks and benefits of COVID monoclonal antibody, and the extent to which such potential risks and benefits are unknown.  3. Information on available alternative treatments and the risks and benefits of those alternatives, including clinical trials.  4. Patients treated with COVID monoclonal antibody should continue to self-isolate and use infection control measures (e.g., wear mask, isolate, social distance, avoid sharing personal items, clean and disinfect "high touch" surfaces, and frequent handwashing) according to CDC guidelines.   5. The patient or parent/caregiver has the option to accept or refuse COVID monoclonal antibody  treatment.  After reviewing this information with the patient, the patient has agreed to receive one of the available covid 19 monoclonal antibodies and will be provided an appropriate fact sheet prior to infusion.  Sx onset 1/1. Set up for infusion on 07/01/19. Directions given to [facility]. Pt is aware that insurance will be charged an infusion fee. Pt is unvaccinated. + home test.   Cline Crock 06/29/2020 12:07 PM

## 2020-06-30 ENCOUNTER — Ambulatory Visit (HOSPITAL_COMMUNITY)
Admission: RE | Admit: 2020-06-30 | Discharge: 2020-06-30 | Disposition: A | Discharge status: Home / Self Care | Payer: 59 | Source: Ambulatory Visit | Attending: Pulmonary Disease | Admitting: Pulmonary Disease

## 2020-06-30 DIAGNOSIS — U071 COVID-19: Secondary | ICD-10-CM | POA: Insufficient documentation

## 2020-06-30 DIAGNOSIS — Z6825 Body mass index (BMI) 25.0-25.9, adult: Secondary | ICD-10-CM | POA: Diagnosis not present

## 2020-06-30 DIAGNOSIS — J410 Simple chronic bronchitis: Secondary | ICD-10-CM | POA: Diagnosis not present

## 2020-06-30 DIAGNOSIS — R54 Age-related physical debility: Secondary | ICD-10-CM | POA: Diagnosis not present

## 2020-06-30 DIAGNOSIS — J449 Chronic obstructive pulmonary disease, unspecified: Secondary | ICD-10-CM

## 2020-06-30 MED ORDER — DIPHENHYDRAMINE HCL 50 MG/ML IJ SOLN: 50.0000 mg | Freq: Once | INTRAMUSCULAR | Status: DC | PRN

## 2020-06-30 MED ORDER — FAMOTIDINE IN NACL 20-0.9 MG/50ML-% IV SOLN: 20.0000 mg | Freq: Once | INTRAVENOUS | Status: DC | PRN

## 2020-06-30 MED ORDER — SOTROVIMAB 500 MG/8ML IV SOLN
500.0000 mg | Freq: Once | INTRAVENOUS | Status: AC
  Administered 2020-06-30: 500 mg via INTRAVENOUS

## 2020-06-30 MED ORDER — EPINEPHRINE 0.3 MG/0.3ML IJ SOAJ: 0.3000 mg | Freq: Once | INTRAMUSCULAR | Status: DC | PRN

## 2020-06-30 MED ORDER — ALBUTEROL SULFATE HFA 108 (90 BASE) MCG/ACT IN AERS: 2.0000 | INHALATION_SPRAY | Freq: Once | RESPIRATORY_TRACT | Status: DC | PRN

## 2020-06-30 MED ORDER — METHYLPREDNISOLONE SODIUM SUCC 125 MG IJ SOLR: 125.0000 mg | Freq: Once | INTRAMUSCULAR | Status: DC | PRN

## 2020-06-30 MED ORDER — SODIUM CHLORIDE 0.9 % IV SOLN: INTRAVENOUS | Status: DC | PRN

## 2020-06-30 NOTE — Progress Notes (Signed)
Diagnosis: COVID-19  Physician: Dr. Patrick Wright  Procedure: Cov[facility] Infusion Clinic Med: Sotrovimab infusion - Provided patient with sotrovimab fact sheet for patients, parents, and caregivers prior to infusion.   Complications: No immediate complications noted  Discharge: Discharged home    

## 2020-06-30 NOTE — Progress Notes (Signed)
Patient reviewed Fact Sheet for Patients, Parents, and Caregivers for Emergency Use Authorization (EUA) of Sotrovimab for the Treatment of Coronavirus. Patient also reviewed and is agreeable to the estimated cost of treatment. Patient is agreeable to proceed.   

## 2020-06-30 NOTE — Discharge Instructions (Signed)
10 Things You Can Do to Manage Your COVID-19 Symptoms at Home If you have possible or confirmed COVID-19: 1. Stay home from work and school. And stay away from other public places. If you must go out, avoid using any kind of public transportation, ridesharing, or taxis. 2. Monitor your symptoms carefully. If your symptoms get worse, call your healthcare provider immediately. 3. Get rest and stay hydrated. 4. If you have a medical appointment, call the healthcare provider ahead of time and tell them that you have or may have COVID-19. 5. For medical emergencies, call 911 and notify the dispatch personnel that you have or may have COVID-19. 6. Cover your cough and sneezes with a tissue or use the inside of your elbow. 7. Wash your hands often with soap and water for at least 20 seconds or clean your hands with an alcohol-based hand sanitizer that contains at least 60% alcohol. 8. As much as possible, stay in a specific room and away from other people in your home. Also, you should use a separate bathroom, if available. If you need to be around other people in or outside of the home, wear a mask. 9. Avoid sharing personal items with other people in your household, like dishes, towels, and bedding. 10. Clean all surfaces that are touched often, like counters, tabletops, and doorknobs. Use household cleaning sprays or wipes according to the label instructions. cdc.gov/coronavirus 12/27/2018 This information is not intended to replace advice given to you by your health care provider. Make sure you discuss any questions you have with your health care provider. Document Revised: 05/31/2019 Document Reviewed: 05/31/2019 Elsevier Patient Education  2020 Elsevier Inc. What types of side effects do monoclonal antibody drugs cause?  Common side effects  In general, the more common side effects caused by monoclonal antibody drugs include: . Allergic reactions, such as hives or itching . Flu-like signs and  symptoms, including chills, fatigue, fever, and muscle aches and pains . Nausea, vomiting . Diarrhea . Skin rashes . Low blood pressure   The CDC is recommending patients who receive monoclonal antibody treatments wait at least 90 days before being vaccinated.  Currently, there are no data on the safety and efficacy of mRNA COVID-19 vaccines in persons who received monoclonal antibodies or convalescent plasma as part of COVID-19 treatment. Based on the estimated half-life of such therapies as well as evidence suggesting that reinfection is uncommon in the 90 days after initial infection, vaccination should be deferred for at least 90 days, as a precautionary measure until additional information becomes available, to avoid interference of the antibody treatment with vaccine-induced immune responses. If you have any questions or concerns after the infusion please call the Advanced Practice Provider on call at [phone]. This number is ONLY intended for your use regarding questions or concerns about the infusion post-treatment side-effects.  Please do not provide this number to others for use. For return to work notes please contact your primary care provider.   If someone you know is interested in receiving treatment please have them call the COVID hotline at [phone].   

## 2020-07-01 MED ORDER — BENZONATATE 200 MG PO CAPS: 200.0000 mg | ORAL_CAPSULE | Freq: Three times a day (TID) | ORAL | 0 refills | Status: DC | PRN

## 2020-07-07 ENCOUNTER — Other Ambulatory Visit: Payer: Self-pay

## 2020-07-08 ENCOUNTER — Ambulatory Visit: Payer: 59 | Admitting: Internal Medicine

## 2020-07-08 ENCOUNTER — Encounter: Payer: Self-pay | Admitting: Internal Medicine

## 2020-07-08 ENCOUNTER — Other Ambulatory Visit: Payer: Self-pay

## 2020-07-08 VITALS — BP 123/82 | HR 66 | Temp 98.1°F | Resp 18 | Ht 69.0 in | Wt 176.1 lb

## 2020-07-08 DIAGNOSIS — J40 Bronchitis, not specified as acute or chronic: Secondary | ICD-10-CM | POA: Diagnosis not present

## 2020-07-08 MED ORDER — AZITHROMYCIN 250 MG PO TABS: ORAL_TABLET | ORAL | 0 refills | Status: DC

## 2020-07-08 NOTE — Progress Notes (Signed)
Pre visit review using our clinic review tool, if applicable. No additional management support is needed unless otherwise documented below in the visit note. 

## 2020-07-08 NOTE — Patient Instructions (Addendum)
Continue Tessalon Perles and Mucinex DM OTC as needed for cough  Start an antibiotic called Zithromax for possible bronchitis  If you are not gradually better in the next few days let us know.  Please strongly consider get a COVID vaccination 3 months after the day of the infusion.  Also consider flu shot.

## 2020-07-08 NOTE — Progress Notes (Unsigned)
Subjective:    Patient ID: Curtis [name], male    DOB: 1956/06/20, 65 y.o.   MRN: [id number]  DOS:  07/08/2020 Type of visit - description: Acute The patient developed symptoms of COVID: Fever up to 103.5, mild neck pain, headache. He also had mild cough. Tested positive with a home rapid test, subsequently got monoclonal antibody infusion 06/30/2020 Since then overall he has improved. No fever chills. He is here because he still has a cough with green sputum production and chest congestion. Sinuses are also mildly congested.  Appetite is good. No nausea, vomiting, diarrhea. No rash No wheezing that he can tell.  Review of Systems See above   Past Medical History:  Diagnosis Date  . COPD (chronic obstructive pulmonary disease) ([place])   . Depression   . Multiple rib fractures 10/2016   R side after fall  . Neck pain, chronic   . Pneumothorax 10/2016   R side, after fall    Past Surgical History:  Procedure Laterality Date  . CHEST TUBE INSERTION Right 10/2016  . HERNIA REPAIR  2009   right  . KNEE SURGERY Right 1990s   arthroscopy    Allergies as of 07/08/2020   No Known Allergies     Medication List       Accurate as of July 08, 2020 11:59 PM. If you have any questions, ask your nurse or doctor.        azithromycin 250 MG tablet Commonly known as: Zithromax Z-Pak 2 tabs a day the first day, then 1 tab a day x 4 days What changed:   how to take this  when to take this  additional instructions Changed by: Kathlene November, MD   benzonatate 200 MG capsule Commonly known as: TESSALON Take 1 capsule (200 mg total) by mouth 3 (three) times daily as needed for cough.   venlafaxine XR 37.5 MG 24 hr capsule Commonly known as: EFFEXOR-XR Take 1 capsule (37.5 mg total) by mouth daily with breakfast.          Objective:   Physical Exam BP 123/82 (BP Location: Left Arm, Patient Position: Sitting, Cuff Size: Small)   Pulse 66   Temp 98.1 F (36.7 C)  (Oral)   Resp 18   Ht 5\' 9"  (1.753 m)   Wt 176 lb 2 oz (79.9 kg)   SpO2 98%   BMI 26.01 kg/m  General:   Well developed, NAD, BMI noted. HEENT:  Normocephalic . Face symmetric, atraumatic. TMs: Normal.  Nose not congested.  Throat symmetric, not red.  Sinuses non-TTP Lungs:  Very mild large airway congestion with cough. Normal respiratory effort, no intercostal retractions, no accessory muscle use. Heart: RRR,  no murmur.  Lower extremities: no pretibial edema bilaterally  Skin: Not pale. Not jaundice Neurologic:  alert & oriented X3.  Speech normal, gait appropriate for age and unassisted Psych--  Cognition and judgment appear intact.  Cooperative with normal attention span and concentration.  Behavior appropriate. No anxious or depressed appearing.      Assessment     Assessment  Depression - on effexor COPD, smoker. PFTs 11-2012 mild COPD Chronic neck pain --- saw Dr Mina Marble 2012 BPH   R PTX 10-2016, traumatic  PLAN Bronchitis: The patient was diagnosed with COVID recently, had a monoclonal antibody infusion 06/30/2020, he is actually better except for cough and greenish sputum production, likely from bronchitis. Plan: Continue Tessalon Perles, Mucinex, add Zithromax.  Call if not gradually better. Preventive care: Strongly  recommend to proceed with COVID vaccination 3 months after his monoclonal antibodies, benefits discussed, he remains reluctant. Also recommend flu shot, he remains reluctant. RTC as scheduled in few days for a physical exam.   This visit occurred during the SARS-CoV-2 public health emergency.  Safety protocols were in place, including screening questions prior to the visit, additional usage of staff PPE, and extensive cleaning of exam room while observing appropriate contact time as indicated for disinfecting solutions.

## 2020-07-09 NOTE — Assessment & Plan Note (Signed)
Bronchitis: The patient was diagnosed with COVID recently, had a monoclonal antibody infusion 06/30/2020, he is actually better except for cough and greenish sputum production, likely from bronchitis. Plan: Continue Tessalon Perles, Mucinex, add Zithromax.  Call if not gradually better. Preventive care: Strongly recommend to proceed with COVID vaccination 3 months after his monoclonal antibodies, benefits discussed, he remains reluctant. Also recommend flu shot, he remains reluctant. RTC as scheduled in few days for a physical exam.

## 2020-07-22 ENCOUNTER — Other Ambulatory Visit: Payer: Self-pay

## 2020-07-22 ENCOUNTER — Ambulatory Visit (INDEPENDENT_AMBULATORY_CARE_PROVIDER_SITE_OTHER): Payer: 59 | Admitting: Internal Medicine

## 2020-07-22 ENCOUNTER — Encounter: Payer: Self-pay | Admitting: Internal Medicine

## 2020-07-22 VITALS — BP 118/79 | HR 68 | Temp 98.1°F | Resp 16 | Ht 69.0 in | Wt 174.2 lb

## 2020-07-22 DIAGNOSIS — Z1211 Encounter for screening for malignant neoplasm of colon: Secondary | ICD-10-CM

## 2020-07-22 DIAGNOSIS — R0989 Other specified symptoms and signs involving the circulatory and respiratory systems: Secondary | ICD-10-CM | POA: Diagnosis not present

## 2020-07-22 DIAGNOSIS — Z125 Encounter for screening for malignant neoplasm of prostate: Secondary | ICD-10-CM | POA: Diagnosis not present

## 2020-07-22 DIAGNOSIS — Z0000 Encounter for general adult medical examination without abnormal findings: Secondary | ICD-10-CM | POA: Diagnosis not present

## 2020-07-22 NOTE — Progress Notes (Addendum)
Subjective:    Patient ID: Curtis [name], male    DOB: 1955-12-20, 65 y.o.   MRN: [id number]  DOS:  07/22/2020 Type of visit - description: CPX Since the last visit respiratory symptoms are improving. Cough  decreased. No fever chills No chest pain or difficulty breathing.   Review of Systems  Other than above, a 14 point review of systems is negative     Past Medical History:  Diagnosis Date   COPD (chronic obstructive pulmonary disease) ([place])    Depression    Multiple rib fractures 10/2016   R side after fall   Neck pain, chronic    Pneumothorax 10/2016   R side, after fall    Past Surgical History:  Procedure Laterality Date   CHEST TUBE INSERTION Right 10/2016   HERNIA REPAIR  2009   right   KNEE SURGERY Right 1990s   arthroscopy    Allergies as of 07/22/2020   No Known Allergies      Medication List        Accurate as of July 22, 2020 11:59 PM. If you have any questions, ask your nurse or doctor.          STOP taking these medications    azithromycin 250 MG tablet Commonly known as: Zithromax Z-Pak Stopped by: Kathlene November, MD       TAKE these medications    benzonatate 200 MG capsule Commonly known as: TESSALON Take 1 capsule (200 mg total) by mouth 3 (three) times daily as needed for cough.   venlafaxine XR 37.5 MG 24 hr capsule Commonly known as: EFFEXOR-XR Take 1 capsule (37.5 mg total) by mouth daily with breakfast.           Objective:   Physical Exam BP 118/79 (BP Location: Left Arm, Patient Position: Sitting, Cuff Size: Small)   Pulse 68   Temp 98.1 F (36.7 C) (Oral)   Resp 16   Ht 5\' 9"  (1.753 m)   Wt 174 lb 4 oz (79 kg)   SpO2 98%   BMI 25.73 kg/m  General: Well developed, NAD, BMI noted Neck: No  thyromegaly  HEENT:  Normocephalic . Face symmetric, atraumatic Lungs:  Slightly decreased breath sounds otherwise clear. Normal respiratory effort, no intercostal retractions, no accessory muscle use. Heart:  RRR,  no murmur.  Abdomen:  Not distended, soft, non-tender. No rebound or rigidity. Palpable nontender aorta on the upper abdomen, no bruit. Lower extremities: no pretibial edema bilaterally DRE: Normal sphincter tone, brown stools, prostate normal. Skin: Exposed areas without rash. Not pale. Not jaundice Neurologic:  alert & oriented X3.  Speech normal, gait appropriate for age and unassisted Strength symmetric and appropriate for age.  Psych: Cognition and judgment appear intact.  Cooperative with normal attention span and concentration.  Behavior appropriate. No anxious or depressed appearing.     Assessment       Assessment  Depression - on effexor COPD, smoker. PFTs 11-2012 mild COPD Chronic neck pain --- saw Dr Mina Marble 2012 BPH   R PTX 10-2016, traumatic Covid infex 06/2020   PLAN Here for CPX Depression: Symptoms controlled COPD: Still vaping, recovering well from recent bronchitis. BPH: Essentially no symptoms at this point. COVID-19/bronchitis: See last visit, doing better. RTC 6 months   This visit occurred during the SARS-CoV-2 public health emergency.  Safety protocols were in place, including screening questions prior to the visit, additional usage of staff PPE, and extensive cleaning of exam room while observing appropriate contact  time as indicated for disinfecting solutions.

## 2020-07-22 NOTE — Progress Notes (Unsigned)
Pre visit review using our clinic review tool, if applicable. No additional management support is needed unless otherwise documented below in the visit note. 

## 2020-07-22 NOTE — Patient Instructions (Addendum)
due for colonoscopy, offered referral  GO TO THE LAB : Get the blood work     Curtis [name], Curtis [name] back for   a checkup in 6 months

## 2020-07-23 LAB — CBC WITH DIFFERENTIAL/PLATELET
Basophils Absolute: 0.1 10*3/uL (ref 0.0–0.1)
Basophils Relative: 1 % (ref 0.0–3.0)
Eosinophils Absolute: 0.1 10*3/uL (ref 0.0–0.7)
Eosinophils Relative: 1.7 % (ref 0.0–5.0)
HCT: 44.3 % (ref 39.0–52.0)
Hemoglobin: 14.7 g/dL (ref 13.0–17.0)
Lymphocytes Relative: 22.7 % (ref 12.0–46.0)
Lymphs Abs: 1.7 10*3/uL (ref 0.7–4.0)
MCHC: 33.3 g/dL (ref 30.0–36.0)
MCV: 87.5 fl (ref 78.0–100.0)
Monocytes Absolute: 0.7 10*3/uL (ref 0.1–1.0)
Monocytes Relative: 9.5 % (ref 3.0–12.0)
Neutro Abs: 4.8 10*3/uL (ref 1.4–7.7)
Neutrophils Relative %: 65.1 % (ref 43.0–77.0)
Platelets: 263 10*3/uL (ref 150.0–400.0)
RBC: 5.06 Mil/uL (ref 4.22–5.81)
RDW: 13.6 % (ref 11.5–15.5)
WBC: 7.4 10*3/uL (ref 4.0–10.5)

## 2020-07-23 LAB — LIPID PANEL
Cholesterol: 191 mg/dL (ref 0–200)
HDL: 69.8 mg/dL (ref >39.00)
LDL Cholesterol: 107 mg/dL — ABNORMAL HIGH (ref 0–99)
NonHDL: 121.38
Total CHOL/HDL Ratio: 3
Triglycerides: 74 mg/dL (ref 0.0–149.0)
VLDL: 14.8 mg/dL (ref 0.0–40.0)

## 2020-07-23 LAB — COMPREHENSIVE METABOLIC PANEL
ALT: 15 U/L (ref 0–53)
AST: 17 U/L (ref 0–37)
Albumin: 4.3 g/dL (ref 3.5–5.2)
Alkaline Phosphatase: 64 U/L (ref 39–117)
BUN: 14 mg/dL (ref 6–23)
CO2: 30 mEq/L (ref 19–32)
Calcium: 9.9 mg/dL (ref 8.4–10.5)
Chloride: 103 mEq/L (ref 96–112)
Creatinine, Ser: 0.85 mg/dL (ref 0.40–1.50)
GFR: 90.58 mL/min (ref >60.00)
Glucose, Bld: 75 mg/dL (ref 70–99)
Potassium: 4.7 mEq/L (ref 3.5–5.1)
Sodium: 139 mEq/L (ref 135–145)
Total Bilirubin: 0.7 mg/dL (ref 0.2–1.2)
Total Protein: 6.5 g/dL (ref 6.0–8.3)

## 2020-07-23 LAB — PSA: PSA: 2.01 ng/mL (ref 0.10–4.00)

## 2020-07-23 LAB — TSH: TSH: 2.03 u[IU]/mL (ref 0.35–4.50)

## 2020-07-23 LAB — VITAMIN D 25 HYDROXY (VIT D DEFICIENCY, FRACTURES): VITD: 49.42 ng/mL (ref 30.00–100.00)

## 2020-07-24 ENCOUNTER — Encounter: Payer: Self-pay | Admitting: Internal Medicine

## 2020-07-24 NOTE — Assessment & Plan Note (Signed)
-   Td 05-2017  - Pneumonia shot 2012; prevnar: 2016 - shingrex d/w pt before  -COVID vaccination: Reluctant to proceed, see previous visit, has not changed his mind  - Flu shot discussed, declined  -CCS: Cscope in 2004 for BRBPR >> hemorrhoids and tics Cscope again 03-2010, tics, had a polyp, benign, per pathology report recheck in 10 years (October 2021). Referral sent  .-Prostate cancer screening: DRE 2015 --->asymmetric prostate , saw Urology, dx BPH . DRE today normal, check a PSA -Diet and exercise discussed -Labs:CMP, FLP, CBC, TSH, PSA, vitamin D -Lung cancer screening:   Smokeed  1PPDfrom age 16 to 44;now vaping. Previous referral for CT screening failed.  Today again discussed the pros and cons, he will let me know if interested. -Screen for AAA, on physical exam he has a palpable aorta, check a [place]

## 2020-07-24 NOTE — Assessment & Plan Note (Signed)
Here for CPX Depression: Symptoms controlled COPD: Still vaping, recovering well from recent bronchitis. BPH: Essentially no symptoms at this point. COVID-19/bronchitis: See last visit, doing better. RTC 6 months

## 2020-08-01 ENCOUNTER — Other Ambulatory Visit: Payer: Self-pay

## 2020-08-01 ENCOUNTER — Ambulatory Visit (HOSPITAL_COMMUNITY)
Admission: RE | Admit: 2020-08-01 | Discharge: 2020-08-01 | Disposition: A | Discharge status: Home / Self Care | Payer: 59 | Source: Ambulatory Visit | Attending: Internal Medicine | Admitting: Internal Medicine

## 2020-08-01 DIAGNOSIS — R0989 Other specified symptoms and signs involving the circulatory and respiratory systems: Secondary | ICD-10-CM | POA: Diagnosis not present

## 2020-09-20 ENCOUNTER — Other Ambulatory Visit: Payer: Self-pay | Admitting: Internal Medicine

## 2021-01-19 ENCOUNTER — Other Ambulatory Visit: Payer: Self-pay

## 2021-01-19 ENCOUNTER — Ambulatory Visit (INDEPENDENT_AMBULATORY_CARE_PROVIDER_SITE_OTHER): Payer: Medicare Other | Admitting: Internal Medicine

## 2021-01-19 ENCOUNTER — Encounter: Payer: Self-pay | Admitting: Internal Medicine

## 2021-01-19 VITALS — BP 129/72 | HR 63 | Temp 98.2°F | Resp 16 | Ht 69.0 in | Wt 174.0 lb

## 2021-01-19 DIAGNOSIS — Z7185 Encounter for immunization safety counseling: Secondary | ICD-10-CM

## 2021-01-19 DIAGNOSIS — J410 Simple chronic bronchitis: Secondary | ICD-10-CM | POA: Diagnosis not present

## 2021-01-19 DIAGNOSIS — E559 Vitamin D deficiency, unspecified: Secondary | ICD-10-CM | POA: Diagnosis not present

## 2021-01-19 DIAGNOSIS — F32A Depression, unspecified: Secondary | ICD-10-CM

## 2021-01-19 LAB — VITAMIN D 25 HYDROXY (VIT D DEFICIENCY, FRACTURES): VITD: 52.38 ng/mL (ref 30.00–100.00)

## 2021-01-19 NOTE — Assessment & Plan Note (Signed)
Depression: On Effexor, currently doing okay. COPD: Still vaping, not ready to quit.  Very few symptoms. Vitamin D deficiency: Patient thinks he has vitamin D deficiency, is taking 6000 units of vitamin D every day, last levels okay.  Asked for a re check.  Will do. Preventive care: Encouraged flu shot, will think about it.  Has declined consistently covid vaccine, encouraged to wear a mask. Social: Retired, he is trying to remain active. RTC 06-2021 CPX

## 2021-01-19 NOTE — Patient Instructions (Signed)
   GO TO THE LAB : Get the blood work     [place], PLEASE SCHEDULE YOUR APPOINTMENTS Come back for  a physical exam by 06-2021

## 2021-01-19 NOTE — Progress Notes (Signed)
   Subjective:    Patient ID: Curtis [name], male    DOB: 1956-04-17, 65 y.o.   MRN: [id number]  DOS:  01/19/2021 Type of visit - description: f/u  Since the last office visit he retired. We talk about depression, vitamin D deficiency. Also vaping and vaccinations.   Review of Systems Has occasional cough and wheezing, very rare  Past Medical History:  Diagnosis Date   COPD (chronic obstructive pulmonary disease) ([place])    Depression    Multiple rib fractures 10/2016   R side after fall   Neck pain, chronic    Pneumothorax 10/2016   R side, after fall    Past Surgical History:  Procedure Laterality Date   CHEST TUBE INSERTION Right 10/2016   HERNIA REPAIR  2009   right   KNEE SURGERY Right 1990s   arthroscopy    Allergies as of 01/19/2021   No Known Allergies      Medication List        Accurate as of January 19, 2021  6:52 PM. If you have any questions, ask your nurse or doctor.          STOP taking these medications    benzonatate 200 MG capsule Commonly known as: TESSALON Stopped by: Kathlene November, MD       TAKE these medications    venlafaxine XR 37.5 MG 24 hr capsule Commonly known as: EFFEXOR-XR TAKE 1 CAPSULE(37.5 MG) BY MOUTH DAILY WITH BREAKFAST           Objective:   Physical Exam BP 129/72 (BP Location: Left Arm, Patient Position: Sitting, Cuff Size: Small)   Pulse 63   Temp 98.2 F (36.8 C) (Oral)   Resp 16   Ht '5\' 9"'$  (1.753 m)   Wt 174 lb (78.9 kg)   SpO2 100%   BMI 25.70 kg/m  General:   Well developed, NAD, BMI noted. HEENT:  Normocephalic . Face symmetric, atraumatic Lungs:  CTA B Normal respiratory effort, no intercostal retractions, no accessory muscle use. Heart: RRR,  no murmur.  Lower extremities: no pretibial edema bilaterally  Skin: Not pale. Not jaundice Neurologic:  alert & oriented X3.  Speech normal, gait appropriate for age and unassisted Psych--  Cognition and judgment appear intact.  Cooperative with  normal attention span and concentration.  Behavior appropriate. No anxious or depressed appearing.      Assessment      Assessment  Depression - on effexor COPD, smoker. PFTs 11-2012 mild COPD Chronic neck pain --- saw Dr Mina Marble 2012 BPH   R PTX 10-2016, traumatic Covid infex 06/2020   PLAN Depression: On Effexor, currently doing okay. COPD: Still vaping, not ready to quit.  Very few symptoms. Vitamin D deficiency: Patient thinks he has vitamin D deficiency, is taking 6000 units of vitamin D every day, last levels okay.  Asked for a re check.  Will do. Preventive care: Encouraged flu shot, will think about it.  Has declined consistently covid vaccine, encouraged to wear a mask. Social: Retired, he is trying to remain active. RTC 06-2021 CPX  This visit occurred during the SARS-CoV-2 public health emergency.  Safety protocols were in place, including screening questions prior to the visit, additional usage of staff PPE, and extensive cleaning of exam room while observing appropriate contact time as indicated for disinfecting solutions.

## 2021-01-21 ENCOUNTER — Encounter: Payer: Self-pay | Admitting: Internal Medicine

## 2021-02-02 LAB — HM COLONOSCOPY

## 2021-02-15 ENCOUNTER — Other Ambulatory Visit: Payer: Self-pay | Admitting: Internal Medicine

## 2021-06-12 ENCOUNTER — Encounter: Payer: Self-pay | Admitting: Internal Medicine

## 2021-07-23 ENCOUNTER — Encounter: Payer: Self-pay | Admitting: Internal Medicine

## 2021-07-23 ENCOUNTER — Ambulatory Visit (INDEPENDENT_AMBULATORY_CARE_PROVIDER_SITE_OTHER): Payer: Medicare Other | Admitting: Internal Medicine

## 2021-07-23 VITALS — BP 124/86 | HR 61 | Temp 97.9°F | Resp 16 | Ht 69.0 in | Wt 179.4 lb

## 2021-07-23 DIAGNOSIS — Z0000 Encounter for general adult medical examination without abnormal findings: Secondary | ICD-10-CM | POA: Diagnosis not present

## 2021-07-23 DIAGNOSIS — Z23 Encounter for immunization: Secondary | ICD-10-CM | POA: Diagnosis not present

## 2021-07-23 LAB — COMPREHENSIVE METABOLIC PANEL
ALT: 16 U/L (ref 0–53)
AST: 20 U/L (ref 0–37)
Albumin: 4.5 g/dL (ref 3.5–5.2)
Alkaline Phosphatase: 58 U/L (ref 39–117)
BUN: 18 mg/dL (ref 6–23)
CO2: 30 mEq/L (ref 19–32)
Calcium: 10 mg/dL (ref 8.4–10.5)
Chloride: 103 mEq/L (ref 96–112)
Creatinine, Ser: 0.99 mg/dL (ref 0.40–1.50)
GFR: 78.85 mL/min (ref >60.00)
Glucose, Bld: 87 mg/dL (ref 70–99)
Potassium: 4.9 mEq/L (ref 3.5–5.1)
Sodium: 141 mEq/L (ref 135–145)
Total Bilirubin: 0.7 mg/dL (ref 0.2–1.2)
Total Protein: 6.9 g/dL (ref 6.0–8.3)

## 2021-07-23 LAB — CBC WITH DIFFERENTIAL/PLATELET
Basophils Absolute: 0.1 10*3/uL (ref 0.0–0.1)
Basophils Relative: 0.9 % (ref 0.0–3.0)
Eosinophils Absolute: 0.2 10*3/uL (ref 0.0–0.7)
Eosinophils Relative: 2.8 % (ref 0.0–5.0)
HCT: 46.4 % (ref 39.0–52.0)
Hemoglobin: 15.1 g/dL (ref 13.0–17.0)
Lymphocytes Relative: 21.9 % (ref 12.0–46.0)
Lymphs Abs: 1.4 10*3/uL (ref 0.7–4.0)
MCHC: 32.6 g/dL (ref 30.0–36.0)
MCV: 87.7 fl (ref 78.0–100.0)
Monocytes Absolute: 0.6 10*3/uL (ref 0.1–1.0)
Monocytes Relative: 9.2 % (ref 3.0–12.0)
Neutro Abs: 4.1 10*3/uL (ref 1.4–7.7)
Neutrophils Relative %: 65.2 % (ref 43.0–77.0)
Platelets: 239 10*3/uL (ref 150.0–400.0)
RBC: 5.29 Mil/uL (ref 4.22–5.81)
RDW: 13.6 % (ref 11.5–15.5)
WBC: 6.3 10*3/uL (ref 4.0–10.5)

## 2021-07-23 LAB — LIPID PANEL
Cholesterol: 215 mg/dL — ABNORMAL HIGH (ref 0–200)
HDL: 71.6 mg/dL (ref >39.00)
LDL Cholesterol: 125 mg/dL — ABNORMAL HIGH (ref 0–99)
NonHDL: 143.7
Total CHOL/HDL Ratio: 3
Triglycerides: 95 mg/dL (ref 0.0–149.0)
VLDL: 19 mg/dL (ref 0.0–40.0)

## 2021-07-23 LAB — PSA: PSA: 2.36 ng/mL (ref 0.10–4.00)

## 2021-07-23 NOTE — Assessment & Plan Note (Signed)
-   Td  05-2017  - PNM 23: 2012; PNM 13: 2016; PNM 20 today - shingrex d/w pt    -COVID vaccination: Reluctant to proceed, see previous visits  - Flu shot >  declined  -benefits of vaccines d/w pt -CCS: Cscope in 2004 ;Cscope again 03-2010, cscope 01-2021, next per GI    - prost ca screening: DRE normal today, check PSA -Diet and exercise: Doing well. -Labs:  CMP, FLP, CBC, PSA -Lung cancer screening:   Smoked  1 PPD  from age 66 to 28; now vaping.  See previous entries, advised to let me know if he is ever interested. -Screen for AAA: US aorta 07-2020: neg -ACP discussed

## 2021-07-23 NOTE — Progress Notes (Addendum)
Subjective:    Patient ID: Curtis [name], male    DOB: 01/06/1956, 66 y.o.   MRN: [id number]  DOS:  07/23/2021 Type of visit - description: CPX  Since the last office visit is doing well. Has occasional cough in the morning, no wheezing, no shortness of breath, no DOE.   Wt Readings from Last 3 Encounters:  07/23/21 179 lb 6 oz (81.4 kg)  01/19/21 174 lb (78.9 kg)  07/22/20 174 lb 4 oz (79 kg)    Review of Systems  Other than above, a 14 point review of systems is negative      Past Medical History:  Diagnosis Date   COPD (chronic obstructive pulmonary disease) ([facility])    Depression    Multiple rib fractures 10/2016   R side after fall   Neck pain, chronic    Pneumothorax 10/2016   R side, after fall    Past Surgical History:  Procedure Laterality Date   CHEST TUBE INSERTION Right 10/2016   HERNIA REPAIR  2009   right   KNEE SURGERY Right 1990s   arthroscopy   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    Employer: [employer]  Tobacco Use   Smoking status: Former    Packs/day: 1.30    Years: 40.00    Pack years: 52.00    Types: Cigarettes, E-cigarettes   Smokeless tobacco: Never   Tobacco comments:    quit cigarrets, vapes since ~ 05/2017  Vaping Use   Vaping Use: Former  Substance and Sexual Activity   Alcohol use: Yes    Comment: socially    Drug use: No   Sexual activity: Not on file  Other Topics Concern   Not on file  Social History Narrative   Lives w/ wife   4 children   --1 son h/o of substance abuse Air traffic controller) , he lives w/ them   --1 son is a Teacher, early years/pre    --Daughter Rexene Alberts NP for ID, 2 children   --Daughter Jasmine December, 1 child             Social Determinants of Health   Financial Resource Strain: Not on file  Food  Insecurity: Not on file  Transportation Needs: Not on file  Physical Activity: Not on file  Stress: Not on file  Social Connections: Not on file  Intimate Partner Violence: Not on file    Current Outpatient Medications  Medication Instructions   cholecalciferol (VITAMIN D3) 1,000 Units, Oral, Daily   Multiple Vitamin (MULTIVITAMIN WITH MINERALS) TABS tablet 1 tablet, Oral, Daily   venlafaxine XR (EFFEXOR-XR) 37.5 MG 24 hr capsule TAKE 1 CAPSULE(37.5 MG) BY MOUTH DAILY WITH BREAKFAST   vitamin B-12 (CYANOCOBALAMIN) 500 mcg, Oral, Daily   zinc gluconate 50 mg, Oral, Daily       Objective:   Physical Exam BP 124/86 (BP Location: Left Arm, Patient Position: Sitting, Cuff Size: Small)   Pulse 61   Temp 97.9 F (36.6 C) (Oral)   Resp 16   Ht 5\' 9"  (1.753 m)   Wt 179 lb 6 oz (81.4 kg)   SpO2 96%   BMI 26.49 kg/m  General: Well developed, NAD, BMI noted Neck: No  thyromegaly  HEENT:  Normocephalic . Face  symmetric, atraumatic Lungs:  CTA B Normal respiratory effort, no intercostal retractions, no accessory muscle use. Heart: RRR,  no murmur.  Abdomen:  Not distended, soft, non-tender. No rebound or rigidity.   Lower extremities: no pretibial edema bilaterally  Skin: Exposed areas without rash. Not pale. Not jaundice DRE: Normal sphincter tone, stools brown, prostate symmetric, nontender, mild enlargement noted. Neurologic:  alert & oriented X3.  Speech normal, gait appropriate for age and unassisted Strength symmetric and appropriate for age.  Psych: Cognition and judgment appear intact.  Cooperative with normal attention span and concentration.  Behavior appropriate. No anxious or depressed appearing.     Assessment    Assessment  Depression - on effexor COPD, smoker. PFTs 11-2012 mild COPD Chronic neck pain --- saw Dr Regino Schultze 2012 BPH (asymmetric prostate, saw urology before) R PTX 10-2016, traumatic Covid infex 06/2020   PLAN Here for CPX Depression: On  Effexor.  Controlled. COPD, smoker: Currently vaping.  Minimal symptoms. RTC 1 year    This visit occurred during the SARS-CoV-2 public health emergency.  Safety protocols were in place, including screening questions prior to the visit, additional usage of staff PPE, and extensive cleaning of exam room while observing appropriate contact time as indicated for disinfecting solutions.

## 2021-07-23 NOTE — Patient Instructions (Signed)
Preventive care I recommended: Shingles vaccination COVID-vaccine Flu shot Lung cancer screening with CT.  Please read information about advance care planning  GO TO THE LAB : Get the blood work     [place], [place] back for a physical exam in 1 year

## 2021-07-23 NOTE — Assessment & Plan Note (Signed)
Here for CPX Depression: On Effexor.  Controlled. COPD, smoker: Currently vaping.  Minimal symptoms. RTC 1 year

## 2021-08-12 ENCOUNTER — Other Ambulatory Visit: Payer: Self-pay | Admitting: Internal Medicine

## 2021-09-06 ENCOUNTER — Encounter: Payer: Self-pay | Admitting: Internal Medicine

## 2021-09-07 MED ORDER — VENLAFAXINE HCL ER 37.5 MG PO CP24: ORAL_CAPSULE | ORAL | 3 refills | Status: DC

## 2022-01-15 ENCOUNTER — Telehealth: Payer: Self-pay | Admitting: Internal Medicine

## 2022-01-15 NOTE — Telephone Encounter (Signed)
Left message for patient to call back and schedule Medicare Annual Wellness Visit (AWV).   Please offer to do virtually or by telephone.  Left office number and my jabber #[phone].  AWVI eligible as of 12/26/2021  Please schedule at anytime with Nurse Health Advisor.  

## 2022-03-08 ENCOUNTER — Ambulatory Visit (INDEPENDENT_AMBULATORY_CARE_PROVIDER_SITE_OTHER): Payer: Medicare Other

## 2022-03-08 VITALS — Ht 69.0 in | Wt 168.0 lb

## 2022-03-08 DIAGNOSIS — Z0000 Encounter for general adult medical examination without abnormal findings: Secondary | ICD-10-CM | POA: Diagnosis not present

## 2022-03-08 DIAGNOSIS — Z122 Encounter for screening for malignant neoplasm of respiratory organs: Secondary | ICD-10-CM

## 2022-03-08 NOTE — Progress Notes (Addendum)
Subjective:   Curtis [name] is a 66 y.o. male who presents for an Initial Medicare Annual Wellness Visit.  I connected with Curtis [name] today by telephone and verified that I am speaking with the correct person using two identifiers. Location patient: home Location provider: work Persons participating in the virtual visit: patient, Marine scientist.    I discussed the limitations, risks, security and privacy concerns of performing an evaluation and management service by telephone and the availability of in person appointments. I also discussed with the patient that there may be a patient responsible charge related to this service. The patient expressed understanding and verbally consented to this telephonic visit.    Interactive audio and video telecommunications were attempted between this provider and patient, however failed, due to patient having technical difficulties OR patient did not have access to video capability.  We continued and completed visit with audio only.  Some vital signs may be absent or patient reported.   Time Spent with patient on telephone encounter: 25 minutes   Review of Systems     Cardiac Risk Factors include: advanced age (>71mn, >>64women);male gender     Objective:    Today's Vitals   03/08/22 1302  Weight: 168 lb (76.2 kg)  Height: '5\' 9"'$  (1.753 m)   Body mass index is 24.81 kg/m.     03/08/2022    1:08 PM 10/29/2016   10:20 PM  Advanced Directives  Does Patient Have a Medical Advance Directive? No No  Would patient like information on creating a medical advance directive?  No - Patient declined    Current Medications (verified) Outpatient Encounter Medications as of 03/08/2022  Medication Sig   cholecalciferol (VITAMIN D3) 25 MCG (1000 UNIT) tablet Take 1,000 Units by mouth daily.   Multiple Vitamin (MULTIVITAMIN WITH MINERALS) TABS tablet Take 1 tablet by mouth daily.   venlafaxine XR (EFFEXOR-XR) 37.5 MG 24 hr capsule TAKE 1 CAPSULE(37.5 MG) BY  MOUTH DAILY WITH BREAKFAST   vitamin B-12 (CYANOCOBALAMIN) 500 MCG tablet Take 500 mcg by mouth daily.   zinc gluconate 50 MG tablet Take 50 mg by mouth daily.   No facility-administered encounter medications on file as of 03/08/2022.    Allergies (verified) Patient has no known allergies.   History: Past Medical History:  Diagnosis Date   [place]    Depression    Multiple rib fractures 10/2016   R side after fall   Neck pain, chronic    Pneumothorax 10/2016   R side, after fall   Past Surgical History:  Procedure Laterality Date   CHEST TUBE INSERTION Right 10/2016   HERNIA REPAIR  2009   right   KNEE SURGERY Right 1990s   arthroscopy   Family History  Problem Relation Age of Onset   Cancer Mother        skin   Breast cancer Mother 98  COPD Father    Diabetes Father    Stroke Father 813  Alcohol abuse Son    Colon cancer Neg Hx    Prostate cancer Neg Hx    CAD Neg Hx        MGF?   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  Employer: [employer]  Tobacco Use   Smoking status: Former    Packs/day: 1.30    Years: 40.00    Total pack years: 52.00    Types: Cigarettes, E-cigarettes   Smokeless tobacco: Never   Tobacco comments:    quit cigarrets, vapes since ~ 05/2017  Vaping Use   Vaping Use: Former  Substance and Sexual Activity   Alcohol use: Yes    Comment: socially    Drug use: No   Sexual activity: Not on file  Other Topics Concern   Not on file  Social History Narrative   Lives w/ wife   4 children   --1 son h/o of substance abuse Curtis [name]) , he lives w/ them   --1 son is a Software engineer    --Daughter Curtis [name] for ID, 2 children   --Daughter Curtis [name], 1 child             Social Determinants  of Health   Financial Resource Strain: Low Risk  (03/08/2022)   Overall Financial Resource Strain (CARDIA)    Difficulty of Paying Living Expenses: Not hard at all  Food Insecurity: No Food Insecurity (03/08/2022)   Hunger Vital Sign    Worried About Running Out of Food in the Last Year: Never true    [place] in the Last Year: Never true  Transportation Needs: No Transportation Needs (03/08/2022)   PRAPARE - Hydrologist (Medical): No    Lack of Transportation (Non-Medical): No  Physical Activity: Sufficiently Active (03/08/2022)   Exercise Vital Sign    Days of Exercise per Week: 7 days    Minutes of Exercise per Session: 30 min  Stress: No Stress Concern Present (03/08/2022)   Ballplay    Feeling of Stress : Not at all  Social Connections: Moderately Isolated (03/08/2022)   Social Connection and Isolation Panel [NHANES]    Frequency of Communication with Friends and Family: More than three times a week    Frequency of Social Gatherings with Friends and Family: More than three times a week    Attends Religious Services: Never    Marine scientist or Organizations: No    Attends Archivist Meetings: Never    Marital Status: Married    Tobacco Counseling Counseling given: Not Answered Tobacco comments: quit cigarrets, vapes since ~ 05/2017   Clinical Intake:  Pre-visit preparation completed: Yes  Pain : No/denies pain     BMI - recorded: 24.81 Nutritional Status: BMI of 19-24  Normal Nutritional Risks: None Diabetes: No  How often do you need to have someone help you when you read instructions, pamphlets, or other written materials from your doctor or pharmacy?: 1 - Never  Diabetic?No  Interpreter Needed?: No  Information entered by :: Caroleen Hamman LPN   Activities of Daily Living    03/08/2022    1:13 PM  In your present state of health, do you  have any difficulty performing the following activities:  Hearing? 0  Vision? 0  Difficulty concentrating or making decisions? 0  Walking or climbing stairs? 0  Dressing or bathing? 0  Doing errands, shopping? 0  Preparing Food and eating ? N  Using the Toilet? N  In the past six months, have you accidently leaked urine? N  Do you have problems with loss of bowel control? N  Managing your Medications? N  Managing your Finances? N  Housekeeping  or managing your Housekeeping? N    Patient Care Team: Colon Branch, MD as PCP - Hosie Poisson, Altamese Dilling, MD as Consulting Physician (Gastroenterology)  Indicate any recent Medical Services you may have received from other than [facility] providers in the past year (date may be approximate).     Assessment:   This is a routine wellness examination for Curtis [name].  Hearing/Vision screen Hearing Screening - Comments:: No issues Vision Screening - Comments:: Last eye exam-about 6 months ago  Dietary issues and exercise activities discussed: Current Exercise Habits: Home exercise routine, Type of exercise: walking, Time (Minutes): 30, Frequency (Times/Week): 7, Weekly Exercise (Minutes/Week): 210, Intensity: Mild, Exercise limited by: None identified   Goals Addressed             This Visit's Progress    Patient Stated       Continue eating healthy & walking       Depression Screen    03/08/2022    1:13 PM 07/23/2021    9:32 AM 01/19/2021    9:08 AM 01/19/2021    8:32 AM 07/08/2020    9:18 AM 06/20/2019    9:49 AM 06/15/2018   10:20 AM  PHQ 2/9 Scores  PHQ - 2 Score 0 0 0 0 0 0 2  PHQ- 9 Score  0 '2  3 3 4    '$ Fall Risk    03/08/2022    1:11 PM 07/23/2021    9:32 AM 01/19/2021    8:33 AM 07/08/2020    9:17 AM 06/20/2019    9:12 AM  Fall Risk   Falls in the past year? 0 0 0 0 0  Number falls in past yr: 0 0 0 0 0  Injury with Fall? 0 0 0 0 0  Follow up Falls prevention discussed Falls evaluation completed   Falls evaluation completed     FALL RISK PREVENTION PERTAINING TO THE HOME:  Any stairs in or around the home? Yes  If so, are there any without handrails? No  Home free of loose throw rugs in walkways, pet beds, electrical cords, etc? Yes  Adequate lighting in your home to reduce risk of falls? Yes   ASSISTIVE DEVICES UTILIZED TO PREVENT FALLS:  Life alert? No  Use of a cane, walker or w/c? No  Grab bars in the bathroom? No  Shower chair or bench in shower? Yes  Elevated toilet seat or a handicapped toilet? No   TIMED UP AND GO:  Was the test performed? No . Phone visit  Cognitive Function:Normal cognitive status assessed by this Nurse Health Advisor. No abnormalities found.         03/08/2022    1:19 PM  6CIT Screen  What Year? 0 points  What month? 0 points  What time? 0 points  Count back from 20 0 points  Months in reverse 0 points  Repeat phrase 0 points  Total Score 0 points    Immunizations Immunization History  Administered Date(s) Administered   Fluad Quad(high Dose 65+) 06/20/2019   Influenza,inj,Quad PF,6+ Mos 05/02/2013, 03/22/2014, 03/28/2015, 05/25/2016, 06/14/2017   Influenza-Unspecified 05/28/2018   PNEUMOCOCCAL CONJUGATE-20 07/23/2021   Pneumococcal Conjugate-13 03/28/2015   Pneumococcal Polysaccharide-23 05/31/2011   Td 03/31/2007   Tdap 06/14/2017    TDAP status: Up to date  Flu Vaccine status: Due, Education has been provided regarding the importance of this vaccine. Advised may receive this vaccine at local pharmacy or Health Dept. Aware to provide a copy of the vaccination record  if obtained from local pharmacy or Health Dept. Verbalized acceptance and understanding.  Pneumococcal vaccine status: Up to date  Covid-19 vaccine status: Declined, Education has been provided regarding the importance of this vaccine but patient still declined. Advised may receive this vaccine at local pharmacy or Health Dept.or vaccine clinic. Aware to provide a copy of the vaccination  record if obtained from local pharmacy or Health Dept. Verbalized acceptance and understanding.  Qualifies for Shingles Vaccine? Yes   Zostavax completed No   Shingrix Completed?: No.    Education has been provided regarding the importance of this vaccine. Patient has been advised to call insurance company to determine out of pocket expense if they have not yet received this vaccine. Advised may also receive vaccine at local pharmacy or Health Dept. Verbalized acceptance and understanding.  Screening Tests Health Maintenance  Topic Date Due   Zoster Vaccines- Shingrix (1 of 2) Never done   INFLUENZA VACCINE  01/26/2022   COVID-19 Vaccine (1) 07/23/2022 (Originally 08/07/1956)   TETANUS/TDAP  06/15/2027   COLONOSCOPY (Pts 45-69yr Insurance coverage will need to be confirmed)  02/03/2031   Pneumonia Vaccine 66 Years old  Completed   Hepatitis C Screening  Completed   HPV VACCINES  Aged Out    Health Maintenance  Health Maintenance Due  Topic Date Due   Zoster Vaccines- Shingrix (1 of 2) Never done   INFLUENZA VACCINE  01/26/2022    Colorectal cancer screening: Type of screening: Colonoscopy. Completed 02/02/2021. Repeat every 10 years  Lung Cancer Screening: (Low Dose CT Chest recommended if Age 168-80years, 30 pack-year currently smoking OR have quit w/in 15years.) does qualify.   Lung Cancer Screening Referral:  ordered today  Additional Screening:  Hepatitis C Screening: Completed 10/19/2025  Vision Screening: Recommended annual ophthalmology exams for early detection of glaucoma and other disorders of the eye. Is the patient up to date with their annual eye exam?  Yes  Who is the provider or what is the name of the office in which the patient attends annual eye exams? [facility]   Dental Screening: Recommended annual dental exams for proper oral hygiene  Community Resource Referral / Chronic Care Management: CRR required this visit?  No   CCM required this visit?  No       Plan:     I have personally reviewed and noted the following in the patient's chart:   Medical and social history Use of alcohol, tobacco or illicit drugs  Current medications and supplements including opioid prescriptions. Patient is not currently taking opioid prescriptions. Functional ability and status Nutritional status Physical activity Advanced directives List of other physicians Hospitalizations, surgeries, and ER visits in previous 12 months Vitals Screenings to include cognitive, depression, and falls Referrals and appointments  In addition, I have reviewed and discussed with patient certain preventive protocols, quality metrics, and best practice recommendations. A written personalized care plan for preventive services as well as general preventive health recommendations were provided to patient.   Due to this being a telephonic visit, the after visit summary with patients personalized plan was offered to patient via mail or my-chart.  Patient would like to access on my-chart.   MMarta Antu LPN   [id number] Nurse Health Advisor  Nurse Notes: None  I have reviewed and agree with Health Coaches documentation.  JKathlene November MD

## 2022-03-08 NOTE — Patient Instructions (Addendum)
Curtis [name] , Thank you for taking time to complete your Medicare Wellness Visit. I appreciate your ongoing commitment to your health goals. Please review the following plan we discussed and let me know if I can assist you in the future.   Screening recommendations/referrals: Colonoscopy: Completed 02/02/2021-Due 02/03/2031 Lung Cancer Screening Referral:  Ordered today. Someone will call you to schedule.  Recommended yearly ophthalmology/optometry visit for glaucoma screening and checkup Recommended yearly dental visit for hygiene and checkup  Vaccinations: Influenza vaccine: Declined Pneumococcal vaccine: Up to date Tdap vaccine: Up to date Shingles vaccine: Due-May obtain vaccine at your local pharmacy. Covid-19: May obtain vaccine at your local pharmacy.  Advanced directives: May pick up information at your next office visit  Conditions/risks identified: See problem list  Next appointment: Follow up in one year for your annual wellness visit.   Preventive Care 18 Years and Older, Male Preventive care refers to lifestyle choices and visits with your health care provider that can promote health and wellness. What does preventive care include? A yearly physical exam. This is also called an annual well check. Dental exams once or twice a year. Routine eye exams. Ask your health care provider how often you should have your eyes checked. Personal lifestyle choices, including: Daily care of your teeth and gums. Regular physical activity. Eating a healthy diet. Avoiding tobacco and drug use. Limiting alcohol use. Practicing safe sex. Taking low doses of aspirin every day. Taking vitamin and mineral supplements as recommended by your health care provider. What happens during an annual well check? The services and screenings done by your health care provider during your annual well check will depend on your age, overall health, lifestyle risk factors, and family history of  disease. Counseling  Your health care provider may ask you questions about your: Alcohol use. Tobacco use. Drug use. Emotional well-being. Home and relationship well-being. Sexual activity. Eating habits. History of falls. Memory and ability to understand (cognition). Work and work Statistician. Screening  You may have the following tests or measurements: Height, weight, and BMI. Blood pressure. Lipid and cholesterol levels. These may be checked every 5 years, or more frequently if you are over 67 years old. Skin check. Lung cancer screening. You may have this screening every year starting at age 58 if you have a 30-pack-year history of smoking and currently smoke or have quit within the past 15 years. Fecal occult blood test (FOBT) of the stool. You may have this test every year starting at age 4. Flexible sigmoidoscopy or colonoscopy. You may have a sigmoidoscopy every 5 years or a colonoscopy every 10 years starting at age 35. Prostate cancer screening. Recommendations will vary depending on your family history and other risks. Hepatitis C blood test. Hepatitis B blood test. Sexually transmitted disease (STD) testing. Diabetes screening. This is done by checking your blood sugar (glucose) after you have not eaten for a while (fasting). You may have this done every 1-3 years. Abdominal aortic aneurysm (AAA) screening. You may need this if you are a current or former smoker. Osteoporosis. You may be screened starting at age 45 if you are at high risk. Talk with your health care provider about your test results, treatment options, and if necessary, the need for more tests. Vaccines  Your health care provider may recommend certain vaccines, such as: Influenza vaccine. This is recommended every year. Tetanus, diphtheria, and acellular pertussis (Tdap, Td) vaccine. You may need a Td booster every 10 years. Zoster vaccine. You may need  this after age 54. Pneumococcal 13-valent  conjugate (PCV13) vaccine. One dose is recommended after age 48. Pneumococcal polysaccharide (PPSV23) vaccine. One dose is recommended after age 54. Talk to your health care provider about which screenings and vaccines you need and how often you need them. This information is not intended to replace advice given to you by your health care provider. Make sure you discuss any questions you have with your health care provider. Document Released: 07/11/2015 Document Revised: 03/03/2016 Document Reviewed: 04/15/2015 Elsevier Interactive Patient Education  2017 [employer] Prevention in the Home Falls can cause injuries. They can happen to people of all ages. There are many things you can do to make your home safe and to help prevent falls. What can I do on the outside of my home? Regularly fix the edges of walkways and driveways and fix any cracks. Remove anything that might make you trip as you walk through a door, such as a raised step or threshold. Trim any bushes or trees on the path to your home. Use bright outdoor lighting. Clear any walking paths of anything that might make someone trip, such as rocks or tools. Regularly check to see if handrails are loose or broken. Make sure that both sides of any steps have handrails. Any raised decks and porches should have guardrails on the edges. Have any leaves, snow, or ice cleared regularly. Use sand or salt on walking paths during winter. Clean up any spills in your garage right away. This includes oil or grease spills. What can I do in the bathroom? Use night lights. Install grab bars by the toilet and in the tub and shower. Do not use towel bars as grab bars. Use non-skid mats or decals in the tub or shower. If you need to sit down in the shower, use a plastic, non-slip stool. Keep the floor dry. Clean up any water that spills on the floor as soon as it happens. Remove soap buildup in the tub or shower regularly. Attach bath mats  securely with double-sided non-slip rug tape. Do not have throw rugs and other things on the floor that can make you trip. What can I do in the bedroom? Use night lights. Make sure that you have a light by your bed that is easy to reach. Do not use any sheets or blankets that are too big for your bed. They should not hang down onto the floor. Have a firm chair that has side arms. You can use this for support while you get dressed. Do not have throw rugs and other things on the floor that can make you trip. What can I do in the kitchen? Clean up any spills right away. Avoid walking on wet floors. Keep items that you use a lot in easy-to-reach places. If you need to reach something above you, use a strong step stool that has a grab bar. Keep electrical cords out of the way. Do not use floor polish or wax that makes floors slippery. If you must use wax, use non-skid floor wax. Do not have throw rugs and other things on the floor that can make you trip. What can I do with my stairs? Do not leave any items on the stairs. Make sure that there are handrails on both sides of the stairs and use them. Fix handrails that are broken or loose. Make sure that handrails are as long as the stairways. Check any carpeting to make sure that it is firmly attached to  the stairs. Fix any carpet that is loose or worn. Avoid having throw rugs at the top or bottom of the stairs. If you do have throw rugs, attach them to the floor with carpet tape. Make sure that you have a light switch at the top of the stairs and the bottom of the stairs. If you do not have them, ask someone to add them for you. What else can I do to help prevent falls? Wear shoes that: Do not have high heels. Have rubber bottoms. Are comfortable and fit you well. Are closed at the toe. Do not wear sandals. If you use a stepladder: Make sure that it is fully opened. Do not climb a closed stepladder. Make sure that both sides of the stepladder  are locked into place. Ask someone to hold it for you, if possible. Clearly mark and make sure that you can see: Any grab bars or handrails. First and last steps. Where the edge of each step is. Use tools that help you move around (mobility aids) if they are needed. These include: Canes. Walkers. Scooters. Crutches. Turn on the lights when you go into a dark area. Replace any light bulbs as soon as they burn out. Set up your furniture so you have a clear path. Avoid moving your furniture around. If any of your floors are uneven, fix them. If there are any pets around you, be aware of where they are. Review your medicines with your doctor. Some medicines can make you feel dizzy. This can increase your chance of falling. Ask your doctor what other things that you can do to help prevent falls. This information is not intended to replace advice given to you by your health care provider. Make sure you discuss any questions you have with your health care provider. Document Released: 04/10/2009 Document Revised: 11/20/2015 Document Reviewed: 07/19/2014 Elsevier Interactive Patient Education  2017 [employer].

## 2022-03-29 ENCOUNTER — Telehealth: Payer: Medicare Other | Admitting: Physician Assistant

## 2022-03-29 ENCOUNTER — Other Ambulatory Visit: Payer: Self-pay

## 2022-03-29 ENCOUNTER — Encounter: Payer: Self-pay | Admitting: Internal Medicine

## 2022-03-29 DIAGNOSIS — J019 Acute sinusitis, unspecified: Secondary | ICD-10-CM | POA: Diagnosis not present

## 2022-03-29 DIAGNOSIS — B9689 Other specified bacterial agents as the cause of diseases classified elsewhere: Secondary | ICD-10-CM | POA: Diagnosis not present

## 2022-03-29 DIAGNOSIS — Z87891 Personal history of nicotine dependence: Secondary | ICD-10-CM

## 2022-03-29 DIAGNOSIS — Z122 Encounter for screening for malignant neoplasm of respiratory organs: Secondary | ICD-10-CM

## 2022-03-29 MED ORDER — AMOXICILLIN-POT CLAVULANATE 875-125 MG PO TABS: 1.0000 | ORAL_TABLET | Freq: Two times a day (BID) | ORAL | 0 refills | Status: DC

## 2022-03-29 NOTE — Progress Notes (Signed)

## 2022-03-29 NOTE — Progress Notes (Signed)
I have spent 5 minutes in review of e-visit questionnaire, review and updating patient chart, medical decision making and response to patient.   Suzanne Kho Cody Madolyn Ackroyd, PA-C    

## 2022-04-28 ENCOUNTER — Ambulatory Visit (INDEPENDENT_AMBULATORY_CARE_PROVIDER_SITE_OTHER): Payer: Medicare Other | Admitting: Acute Care

## 2022-04-28 ENCOUNTER — Encounter: Payer: Self-pay | Admitting: Acute Care

## 2022-04-28 DIAGNOSIS — Z87891 Personal history of nicotine dependence: Secondary | ICD-10-CM | POA: Diagnosis not present

## 2022-04-28 NOTE — Progress Notes (Signed)
Virtual Visit via Telephone Note  I connected with Curtis [name] on 04/28/22 at  8:30 AM EDT by telephone and verified that I am speaking with the correct person using two identifiers.  Location: Patient: At home Provider: [place], [place], [place], [address]    I discussed the limitations, risks, security and privacy concerns of performing an evaluation and management service by telephone and the availability of in person appointments. I also discussed with the patient that there may be a patient responsible charge related to this service. The patient expressed understanding and agreed to proceed.   Shared Decision Making Visit Lung Cancer Screening Program [phone])   Eligibility: Age 66 y.o. Pack Years Smoking History Calculation 52 pack year smoking history (# packs/per year x # years smoked) Recent History of coughing up blood  no Unexplained weight loss? no ( >Than 15 pounds within the last 6 months ) Prior History Lung / other cancer no (Diagnosis within the last 5 years already requiring surveillance chest CT Scans). Smoking Status Former Smoker Former Smokers: Years since quit: 6 years  Quit Date: 2016  Visit Components: Discussion included one or more decision making aids. yes Discussion included risk/benefits of screening. yes Discussion included potential follow up diagnostic testing for abnormal scans. yes Discussion included meaning and risk of over diagnosis. yes Discussion included meaning and risk of False Positives. yes Discussion included meaning of total radiation exposure. yes  Counseling Included: Importance of adherence to annual lung cancer LDCT screening. yes Impact of comorbidities on ability to participate in the program. yes Ability and willingness to under diagnostic treatment. yes  Smoking Cessation Counseling: Current Smokers:  Discussed importance of smoking cessation. yes Information about tobacco cessation classes and  interventions provided to patient. yes Patient provided with "ticket" for LDCT Scan. yes Symptomatic Patient. no  Counseling NA Diagnosis Code: Tobacco Use Z72.0 Asymptomatic Patient yes  Counseling (Intermediate counseling: > three minutes counseling) P7106 Former Smokers:  Discussed the importance of maintaining cigarette abstinence. yes Diagnosis Code: Personal History of Nicotine Dependence. Y69.485 Information about tobacco cessation classes and interventions provided to patient. Yes Patient provided with "ticket" for LDCT Scan. yes Written Order for Lung Cancer Screening with LDCT placed in Epic. Yes (CT Chest Lung Cancer Screening Low Dose W/O CM) [id number] Z12.2-Screening of respiratory organs Z87.891-Personal history of nicotine dependence  I spent 25 minutes of face to face time/virtual visit time  with  Curtis [name] discussing the risks and benefits of lung cancer screening. We took the time to pause the power point at intervals to allow for questions to be asked and answered to ensure understanding. We discussed that he had taken the single most powerful action possible to decrease his risk of developing lung cancer when he quit smoking. I counseled him to remain smoke free, and to contact me if he ever had the desire to smoke again so that I can provide resources and tools to help support the effort to remain smoke free. We discussed the time and location of the scan, and that either  Doroteo Glassman RN, Joella Prince, RN or I  or I will call / send a letter with the results within  24-72 hours of receiving them. He has the office contact information in the event he needs to speak with me,  he verbalized understanding of all of the above and had no further questions upon leaving the office.     I explained to the patient that there has been  a high incidence of coronary artery disease noted on these exams. I explained that this is a non-gated exam therefore degree or severity cannot be  determined. This patient is not on statin therapy. I have asked the patient to follow-up with their PCP regarding any incidental finding of coronary artery disease and management with diet or medication as they feel is clinically indicated. The patient verbalized understanding of the above and had no further questions.     Magdalen Spatz, NP 04/28/2022

## 2022-04-28 NOTE — Patient Instructions (Signed)
Thank you for participating in the [facility] Cancer Screening Program. It was our pleasure to meet you today. We will call you with the results of your scan within the next few days. Your scan will be assigned a Lung RADS category score by the physicians reading the scans.  This Lung RADS score determines follow up scanning.  See below for description of categories, and follow up screening recommendations. We will be in touch to schedule your follow up screening annually or based on recommendations of our providers. We will fax a copy of your scan results to your Primary Care Physician, or the physician who referred you to the program, to ensure they have the results. Please call the office if you have any questions or concerns regarding your scanning experience or results.  Our office number is [phone]. Please speak with Denise Phelps, RN. , or  Denise Buckner RN, They are  our Lung Cancer Screening RN.'s If They are unavailable when you call, Please leave a message on the voice mail. We will return your call at our earliest convenience.This voice mail is monitored several times a day.  Remember, if your scan is normal, we will scan you annually as long as you continue to meet the criteria for the program. (Age 55-77, Current smoker or smoker who has quit within the last 15 years). If you are a smoker, remember, quitting is the single most powerful action that you can take to decrease your risk of lung cancer and other pulmonary, breathing related problems. We know quitting is hard, and we are here to help.  Please let us know if there is anything we can do to help you meet your goal of quitting. If you are a former smoker, congratulations. We are proud of you! Remain smoke free! Remember you can refer friends or family members through the number above.  We will screen them to make sure they meet criteria for the program. Thank you for helping us take better care of you by  participating in Lung Screening.  You can receive free nicotine replacement therapy ( patches, gum or mints) by calling 1-800-QUIT NOW. Please call so we can get you on the path to becoming  a non-smoker. I know it is hard, but you can do this!  Lung RADS Categories:  Lung RADS 1: no nodules or definitely non-concerning nodules.  Recommendation is for a repeat annual scan in 12 months.  Lung RADS 2:  nodules that are non-concerning in appearance and behavior with a very low likelihood of becoming an active cancer. Recommendation is for a repeat annual scan in 12 months.  Lung RADS 3: nodules that are probably non-concerning , includes nodules with a low likelihood of becoming an active cancer.  Recommendation is for a 6-month repeat screening scan. Often noted after an upper respiratory illness. We will be in touch to make sure you have no questions, and to schedule your 6-month scan.  Lung RADS 4 A: nodules with concerning findings, recommendation is most often for a follow up scan in 3 months or additional testing based on our provider's assessment of the scan. We will be in touch to make sure you have no questions and to schedule the recommended 3 month follow up scan.  Lung RADS 4 B:  indicates findings that are concerning. We will be in touch with you to schedule additional diagnostic testing based on our provider's  assessment of the scan.  Other options for assistance in smoking cessation (   As covered by your insurance benefits)  Hypnosis for smoking cessation  [employer] [phone]  Acupuncture for smoking cessation  [facility] [phone]   

## 2022-04-30 ENCOUNTER — Ambulatory Visit (HOSPITAL_BASED_OUTPATIENT_CLINIC_OR_DEPARTMENT_OTHER): Payer: Medicare Other

## 2022-05-12 ENCOUNTER — Encounter (HOSPITAL_BASED_OUTPATIENT_CLINIC_OR_DEPARTMENT_OTHER): Payer: Self-pay

## 2022-05-12 ENCOUNTER — Ambulatory Visit (HOSPITAL_BASED_OUTPATIENT_CLINIC_OR_DEPARTMENT_OTHER)
Admission: RE | Admit: 2022-05-12 | Discharge: 2022-05-12 | Disposition: A | Discharge status: Home / Self Care | Payer: Medicare Other | Source: Ambulatory Visit | Attending: Acute Care | Admitting: Acute Care

## 2022-05-12 DIAGNOSIS — Z87891 Personal history of nicotine dependence: Secondary | ICD-10-CM | POA: Diagnosis present

## 2022-05-12 DIAGNOSIS — Z122 Encounter for screening for malignant neoplasm of respiratory organs: Secondary | ICD-10-CM | POA: Diagnosis present

## 2022-05-14 ENCOUNTER — Other Ambulatory Visit: Payer: Self-pay | Admitting: Acute Care

## 2022-05-14 DIAGNOSIS — Z122 Encounter for screening for malignant neoplasm of respiratory organs: Secondary | ICD-10-CM

## 2022-05-14 DIAGNOSIS — Z87891 Personal history of nicotine dependence: Secondary | ICD-10-CM

## 2022-07-26 ENCOUNTER — Ambulatory Visit (INDEPENDENT_AMBULATORY_CARE_PROVIDER_SITE_OTHER): Payer: Medicare Other | Admitting: Internal Medicine

## 2022-07-26 ENCOUNTER — Encounter: Payer: Self-pay | Admitting: Internal Medicine

## 2022-07-26 VITALS — BP 120/78 | HR 76 | Temp 97.8°F | Resp 16 | Ht 69.0 in | Wt 173.1 lb

## 2022-07-26 DIAGNOSIS — Z0000 Encounter for general adult medical examination without abnormal findings: Secondary | ICD-10-CM | POA: Diagnosis not present

## 2022-07-26 LAB — CBC WITH DIFFERENTIAL/PLATELET
Basophils Absolute: 0 10*3/uL (ref 0.0–0.1)
Basophils Relative: 0.4 % (ref 0.0–3.0)
Eosinophils Absolute: 0.2 10*3/uL (ref 0.0–0.7)
Eosinophils Relative: 2.1 % (ref 0.0–5.0)
HCT: 44.8 % (ref 39.0–52.0)
Hemoglobin: 15.4 g/dL (ref 13.0–17.0)
Lymphocytes Relative: 19.6 % (ref 12.0–46.0)
Lymphs Abs: 1.6 10*3/uL (ref 0.7–4.0)
MCHC: 34.3 g/dL (ref 30.0–36.0)
MCV: 87.1 fl (ref 78.0–100.0)
Monocytes Absolute: 0.6 10*3/uL (ref 0.1–1.0)
Monocytes Relative: 7.9 % (ref 3.0–12.0)
Neutro Abs: 5.6 10*3/uL (ref 1.4–7.7)
Neutrophils Relative %: 70 % (ref 43.0–77.0)
Platelets: 229 10*3/uL (ref 150.0–400.0)
RBC: 5.15 Mil/uL (ref 4.22–5.81)
RDW: 13.7 % (ref 11.5–15.5)
WBC: 8 10*3/uL (ref 4.0–10.5)

## 2022-07-26 LAB — COMPREHENSIVE METABOLIC PANEL
ALT: 14 U/L (ref 0–53)
AST: 19 U/L (ref 0–37)
Albumin: 4.3 g/dL (ref 3.5–5.2)
Alkaline Phosphatase: 54 U/L (ref 39–117)
BUN: 16 mg/dL (ref 6–23)
CO2: 30 mEq/L (ref 19–32)
Calcium: 9.6 mg/dL (ref 8.4–10.5)
Chloride: 104 mEq/L (ref 96–112)
Creatinine, Ser: 0.88 mg/dL (ref 0.40–1.50)
GFR: 88.38 mL/min (ref >60.00)
Glucose, Bld: 86 mg/dL (ref 70–99)
Potassium: 5.2 mEq/L — ABNORMAL HIGH (ref 3.5–5.1)
Sodium: 142 mEq/L (ref 135–145)
Total Bilirubin: 0.6 mg/dL (ref 0.2–1.2)
Total Protein: 6.4 g/dL (ref 6.0–8.3)

## 2022-07-26 LAB — PSA: PSA: 10.22 ng/mL — ABNORMAL HIGH (ref 0.10–4.00)

## 2022-07-26 LAB — LIPID PANEL
Cholesterol: 193 mg/dL (ref 0–200)
HDL: 70.3 mg/dL (ref >39.00)
LDL Cholesterol: 108 mg/dL — ABNORMAL HIGH (ref 0–99)
NonHDL: 122.67
Total CHOL/HDL Ratio: 3
Triglycerides: 71 mg/dL (ref 0.0–149.0)
VLDL: 14.2 mg/dL (ref 0.0–40.0)

## 2022-07-26 NOTE — Assessment & Plan Note (Signed)
-  Td  05-2017  - RSV rec - PNM 23: 2012; PNM 13: 2016; PNM 20: 2023 - shingrex recommended -COVID vaccination: Has declined  consistently  Flu shot: Declines. -benefits of vaccines d/w pt -CCS: Cscope in 2004 ;Cscope again 03-2010, cscope 01-2021, next per GI    - prostate ca screening: check a PSA, asx  -Diet and exercise: Doing well, retired, exercise regularly. -Labs: CMP FLP CBC PSA -Lung cancer screening:   Smoked  1 PPD  from age 67 to 8; now vaping.  Aware of deleterious effects of vaping CT lung cancer screening 04-2022: COPD, aortic and coronary atherosclerosis.  GERD? -Screen for AAA: US aorta 07-2020: neg -Healthcare POA: See AVS

## 2022-07-26 NOTE — Patient Instructions (Addendum)
Vaccines I recommend:  Shingrix (shingles) Flu shot RSV vaccine COVID booster  Check the  blood pressure regularly BP GOAL is between 110/65 and  135/85. If it is consistently higher or lower, let me know    GO TO THE LAB : Get the blood work     [place], [place] back for a physical exam in 1 year    "Wall of attorney" ,  "Living will" (Advance care planning documents)  If you already have a living will or healthcare power of attorney, is recommended you bring the copy to be scanned in your chart.   The document will be available to all the doctors you see in the system.  Advance care planning is a process that supports adults in  understanding and sharing their preferences regarding future medical care.  The patient's preferences are recorded in documents called Advance Directives and the can be modified at any time while the patient is in full mental capacity.   If you don't have one, please consider create one.      More information at: [email or web address]

## 2022-07-26 NOTE — Progress Notes (Addendum)
Subjective:    Patient ID: Curtis [name], male    DOB: 23-Nov-1955, 67 y.o.   MRN: [id number]  DOS:  07/26/2022 Type of visit - description: CPX  Since the last office visit he is doing well. He takes walks daily without chest pain or DOE. Emotionally doing okay.   Review of Systems  Other than above, a 14 point review of systems is negative      Past Medical History:  Diagnosis Date   COPD (chronic obstructive pulmonary disease) (HCC)    Depression    Multiple rib fractures 10/2016   R side after fall   Neck pain, chronic    Pneumothorax 10/2016   R side, after fall    Past Surgical History:  Procedure Laterality Date   CHEST TUBE INSERTION Right 10/2016   HERNIA REPAIR  2009   right   KNEE SURGERY Right 1990s   arthroscopy   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    Employer: [employer]  Tobacco Use   Smoking status: Former    Packs/day: 1.30    Years: 40.00    Total pack years: 52.00    Types: Cigarettes, E-cigarettes    Quit date: 2016    Years since quitting: 8.0   Smokeless tobacco: Never   Tobacco comments:    quit cigarrets, vapes since ~ 05/2017  Vaping Use   Vaping Use: Former  Substance and Sexual Activity   Alcohol use: Yes    Comment: socially    Drug use: No   Sexual activity: Not on file  Other Topics Concern   Not on file  Social History Narrative   Lives w/ wife   4 children   --1 son h/o of substance abuse Kathlene November) , he lives w/ them   --1 son is a Teacher, early years/pre    --Daughter Rexene Alberts NP for ID, 2 children   --Daughter Jasmine December, 1 child             Social Determinants of Health   Financial Resource Strain: Low Risk  (03/08/2022)   Overall Financial Resource Strain (CARDIA)    Difficulty of Paying Living Expenses: Not  hard at all  Food Insecurity: No Food Insecurity (03/08/2022)   Hunger Vital Sign    Worried About Running Out of Food in the Last Year: Never true    Ran Out of Food in the Last Year: Never true  Transportation Needs: No Transportation Needs (03/08/2022)   PRAPARE - Administrator, Civil Service (Medical): No    Lack of Transportation (Non-Medical): No  Physical Activity: Sufficiently Active (03/08/2022)   Exercise Vital Sign    Days of Exercise per Week: 7 days    Minutes of Exercise per Session: 30 min  Stress: No Stress Concern Present (03/08/2022)   [employer] of Occupational Health - Occupational Stress Questionnaire    Feeling of Stress : Not at all  Social Connections: Moderately Isolated (03/08/2022)   Social Connection and Isolation Panel [NHANES]    Frequency of Communication with Friends and Family: More than three times a week    Frequency of Social Gatherings with Friends and Family: More than three times a week    Attends Religious Services: Never  Active Member of Clubs or Organizations: No    Attends Banker Meetings: Never    Marital Status: Married  Catering manager Violence: Not At Risk (03/08/2022)   Humiliation, Afraid, Rape, and Kick questionnaire    Fear of Current or Ex-Partner: No    Emotionally Abused: No    Physically Abused: No    Sexually Abused: No     Current Outpatient Medications  Medication Instructions   amoxicillin-clavulanate (AUGMENTIN) 875-125 MG tablet 1 tablet, Oral, 2 times daily   cholecalciferol (VITAMIN D3) 1,000 Units, Oral, Daily   cyanocobalamin (VITAMIN B12) 500 mcg, Oral, Daily   Multiple Vitamin (MULTIVITAMIN WITH MINERALS) TABS tablet 1 tablet, Oral, Daily   Probiotic Product (PROBIOTIC-10 PO) Oral   venlafaxine XR (EFFEXOR-XR) 37.5 MG 24 hr capsule TAKE 1 CAPSULE(37.5 MG) BY MOUTH DAILY WITH BREAKFAST   zinc gluconate 50 mg, Oral, Daily       Objective:   Physical Exam BP 120/78   Pulse 76    Temp 97.8 F (36.6 C) (Oral)   Resp 16   Ht 5\' 9"  (1.753 m)   Wt 173 lb 2 oz (78.5 kg)   BMI 25.57 kg/m  General: Well developed, NAD, BMI noted Neck: No  thyromegaly  HEENT:  Normocephalic . Face symmetric, atraumatic Lungs:  CTA B Normal respiratory effort, no intercostal retractions, no accessory muscle use. Heart: RRR,  no murmur.  Abdomen:  Not distended, soft, non-tender. No rebound or rigidity.   Lower extremities: no pretibial edema bilaterally  Skin: Exposed areas without rash. Not pale. Not jaundice Neurologic:  alert & oriented X3.  Speech normal, gait appropriate for age and unassisted Strength symmetric and appropriate for age.  Psych: Cognition and judgment appear intact.  Cooperative with normal attention span and concentration.  Behavior appropriate. No anxious or depressed appearing.     Assessment     Assessment  Depression - on effexor Dyslipidemia COPD, smoker. 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 Listening therapy provided. Dyslipidemia: Advised patient that in his particular case he will definitely benefit from taking statins.  History of atherosclerosis, previous smoker now vaping. He remains somewhat reluctant.  Will check FLP and then provide further advice. RTC 1 year

## 2022-07-26 NOTE — Assessment & Plan Note (Signed)
Here for CPX Depression: On Effexor, emotionally doing well, we talk about his son [name] who has a number of issues. Listening therapy provided. Dyslipidemia: Advised patient that in his particular case he will definitely benefit from taking statins.  History of atherosclerosis, previous smoker now vaping. He remains somewhat reluctant.  Will check FLP and then provide further advice. RTC 1 year

## 2022-07-27 ENCOUNTER — Encounter: Payer: Self-pay | Admitting: Internal Medicine

## 2022-07-28 ENCOUNTER — Other Ambulatory Visit: Payer: Self-pay

## 2022-07-28 DIAGNOSIS — R972 Elevated prostate specific antigen [PSA]: Secondary | ICD-10-CM

## 2022-08-29 ENCOUNTER — Other Ambulatory Visit: Payer: Self-pay | Admitting: Internal Medicine

## 2022-09-02 ENCOUNTER — Encounter: Payer: Self-pay | Admitting: Internal Medicine

## 2022-10-16 ENCOUNTER — Telehealth: Payer: Medicare Other | Admitting: Physician Assistant

## 2022-10-16 DIAGNOSIS — B9689 Other specified bacterial agents as the cause of diseases classified elsewhere: Secondary | ICD-10-CM

## 2022-10-16 DIAGNOSIS — J019 Acute sinusitis, unspecified: Secondary | ICD-10-CM | POA: Diagnosis not present

## 2022-10-16 MED ORDER — AMOXICILLIN-POT CLAVULANATE 875-125 MG PO TABS: 1.0000 | ORAL_TABLET | Freq: Two times a day (BID) | ORAL | 0 refills | Status: DC

## 2022-10-16 NOTE — Progress Notes (Signed)
Virtual Visit Consent   Curtis [name], you are scheduled for a virtual visit with a [facility] provider today. Just as with appointments in the office, your consent must be obtained to participate. Your consent will be active for this visit and any virtual visit you may have with one of our providers in the next 365 days. If you have a MyChart account, a copy of this consent can be sent to you electronically.  As this is a virtual visit, video technology does not allow for your provider to perform a traditional examination. This may limit your provider's ability to fully assess your condition. If your provider identifies any concerns that need to be evaluated in person or the need to arrange testing (such as labs, EKG, etc.), we will make arrangements to do so. Although advances in technology are sophisticated, we cannot ensure that it will always work on either your end or our end. If the connection with a video visit is poor, the visit may have to be switched to a telephone visit. With either a video or telephone visit, we are not always able to ensure that we have a secure connection.  By engaging in this virtual visit, you consent to the provision of healthcare and authorize for your insurance to be billed (if applicable) for the services provided during this visit. Depending on your insurance coverage, you may receive a charge related to this service.  I need to obtain your verbal consent now. Are you willing to proceed with your visit today? [name] has provided verbal consent on 10/16/2022 for a virtual visit (video or telephone). Tylene Fantasia Ward, PA-C  Date: 10/16/2022 11:35 AM  Virtual Visit via Video Note   I, Tylene Fantasia Ward, connected with  Curtis [name]  ([id number], August 23, 1955) on 10/16/22 at 11:30 AM EDT by a video-enabled telemedicine application and verified that I am speaking with the correct person using two identifiers.  Location: Patient: Virtual Visit Location Patient:  Home Provider: Virtual Visit Location Provider: Home Office   I discussed the limitations of evaluation and management by telemedicine and the availability of in person appointments. The patient expressed understanding and agreed to proceed.    History of Present Illness: Curtis [name] is a 67 y.o. who identifies as a male who was assigned male at birth, and is being seen today for about 6-7 days of congestion, postnasal drip, cough.  He reports thick green mucus that is becoming worse. He reports taking a COVID test at home which was negative.  He denies fever, chills, n/v/d, body aches. He is currently taking nothing for the symptoms.   HPI: HPI  Problems:  Patient Active Problem List   Diagnosis Date Noted   PCP NOTES >>>> 03/28/2015   BPH (benign prostatic hyperplasia) 05/02/2013   Annual physical exam 05/31/2011   COPD (chronic obstructive pulmonary disease) 03/19/2011   TOBACCO ABUSE 03/31/2007   Depression 03/31/2007   DISC DISEASE, CERVICAL 03/31/2007    Allergies: No Known Allergies Medications:  Current Outpatient Medications:    amoxicillin-clavulanate (AUGMENTIN) 875-125 MG tablet, Take 1 tablet by mouth 2 (two) times daily., Disp: 20 tablet, Rfl: 0   cholecalciferol (VITAMIN D3) 25 MCG (1000 UNIT) tablet, Take 1,000 Units by mouth daily., Disp: , Rfl:    Multiple Vitamin (MULTIVITAMIN WITH MINERALS) TABS tablet, Take 1 tablet by mouth daily., Disp: , Rfl:    Probiotic Product (PROBIOTIC-10 PO), Take by mouth., Disp: , Rfl:    venlafaxine XR (  EFFEXOR-XR) 37.5 MG 24 hr capsule, TAKE 1 CAPSULE(37.5 MG) BY MOUTH DAILY WITH BREAKFAST, Disp: 90 capsule, Rfl: 3   vitamin B-12 (CYANOCOBALAMIN) 500 MCG tablet, Take 500 mcg by mouth daily., Disp: , Rfl:    zinc gluconate 50 MG tablet, Take 50 mg by mouth daily., Disp: , Rfl:   Observations/Objective: Patient is well-developed, well-nourished in no acute distress.  Resting comfortably at home.  Head is normocephalic, atraumatic.   No labored breathing.  Speech is clear and coherent with logical content.  Patient is alert and oriented at baseline.    Assessment and Plan: 1. Acute bacterial sinusitis - amoxicillin-clavulanate (AUGMENTIN) 875-125 MG tablet; Take 1 tablet by mouth 2 (two) times daily.  Dispense: 20 tablet; Refill: 0  Supportive care discussed.   Follow Up Instructions: I discussed the assessment and treatment plan with the patient. The patient was provided an opportunity to ask questions and all were answered. The patient agreed with the plan and demonstrated an understanding of the instructions.  A copy of instructions were sent to the patient via MyChart unless otherwise noted below.     The patient was advised to call back or seek an in-person evaluation if the symptoms worsen or if the condition fails to improve as anticipated.  Time:  I spent 9 minutes with the patient via telehealth technology discussing the above problems/concerns.    Tylene Fantasia Ward, PA-C

## 2022-10-16 NOTE — Patient Instructions (Signed)
  Kirkland Hun, thank you for joining Tylene Fantasia Ward, PA-C for today's virtual visit.  While this provider is not your primary care provider (PCP), if your PCP is located in our provider database this encounter information will be shared with them immediately following your visit.   A [facility] MyChart account gives you access to today's visit and all your visits, tests, and labs performed at [facility] " click here if you don't have a [facility] MyChart account or go to [email or web address]  Consent: (Patient) Curtis [name] provided verbal consent for this virtual visit at the beginning of the encounter.  Current Medications:  Current Outpatient Medications:    amoxicillin-clavulanate (AUGMENTIN) 875-125 MG tablet, Take 1 tablet by mouth 2 (two) times daily., Disp: 20 tablet, Rfl: 0   cholecalciferol (VITAMIN D3) 25 MCG (1000 UNIT) tablet, Take 1,000 Units by mouth daily., Disp: , Rfl:    Multiple Vitamin (MULTIVITAMIN WITH MINERALS) TABS tablet, Take 1 tablet by mouth daily., Disp: , Rfl:    Probiotic Product (PROBIOTIC-10 PO), Take by mouth., Disp: , Rfl:    venlafaxine XR (EFFEXOR-XR) 37.5 MG 24 hr capsule, TAKE 1 CAPSULE(37.5 MG) BY MOUTH DAILY WITH BREAKFAST, Disp: 90 capsule, Rfl: 3   vitamin B-12 (CYANOCOBALAMIN) 500 MCG tablet, Take 500 mcg by mouth daily., Disp: , Rfl:    zinc gluconate 50 MG tablet, Take 50 mg by mouth daily., Disp: , Rfl:    Medications ordered in this encounter:  Meds ordered this encounter  Medications   amoxicillin-clavulanate (AUGMENTIN) 875-125 MG tablet    Sig: Take 1 tablet by mouth 2 (two) times daily.    Dispense:  20 tablet    Refill:  0    Order Specific Question:   Supervising Provider    Answer:   Merrilee Jansky [id number]     *If you need refills on other medications prior to your next appointment, please contact your pharmacy*  Follow-Up: Call back or seek an in-person evaluation if the symptoms worsen or if the  condition fails to improve as anticipated.  [facility] [phone]  Other Instructions  Take antibiotic as prescribed.  Recommend daily allergy medication like Claritin or Zyrtec.  Recommend Mucinex.  Drink plenty of fluids If no improvement or symptoms become worse follow up for in person evaluation with primary care physician or Urgent Care.   If you have been instructed to have an in-person evaluation today at a local Urgent Care facility, please use the link below. It will take you to a list of all of our available [facility], including address, phone number and hours of operation. Please do not delay care.  [facility]  If you or a family member do not have a primary care provider, use the link below to schedule a visit and establish care. When you choose a [facility] primary care physician or advanced practice provider, you gain a long-term partner in health. Find a Primary Care Provider  Learn more about [facility]'s in-office and virtual care options:  - Get Care Now

## 2022-11-25 ENCOUNTER — Other Ambulatory Visit: Payer: Self-pay | Admitting: Internal Medicine

## 2023-02-05 ENCOUNTER — Other Ambulatory Visit: Payer: Self-pay | Admitting: Acute Care

## 2023-02-05 DIAGNOSIS — Z87891 Personal history of nicotine dependence: Secondary | ICD-10-CM

## 2023-02-05 DIAGNOSIS — Z122 Encounter for screening for malignant neoplasm of respiratory organs: Secondary | ICD-10-CM

## 2023-02-10 ENCOUNTER — Encounter (INDEPENDENT_AMBULATORY_CARE_PROVIDER_SITE_OTHER): Payer: Self-pay

## 2023-03-10 NOTE — Progress Notes (Signed)
Pt didn't answer phone for AWV. This encounter was created in error - please disregard.

## 2023-04-14 ENCOUNTER — Telehealth: Payer: Self-pay | Admitting: Internal Medicine

## 2023-04-14 NOTE — Telephone Encounter (Signed)
Copied from CRM [id number]. Topic: Medicare AWV >> Apr 14, 2023 11:31 AM Payton Doughty wrote: Reason for CRM: LVM 04/14/23 to r/s AWV. New AWV date 04/27/23 at 11am. Please confirm date change khc  Verlee Rossetti; Care Guide Ambulatory Clinical Support [place] [facility] Direct Dial: [phone]

## 2023-04-18 ENCOUNTER — Encounter: Payer: Self-pay | Admitting: Internal Medicine

## 2023-04-18 ENCOUNTER — Ambulatory Visit (INDEPENDENT_AMBULATORY_CARE_PROVIDER_SITE_OTHER): Payer: Medicare Other | Admitting: Internal Medicine

## 2023-04-18 VITALS — BP 126/68 | HR 70 | Temp 97.9°F | Resp 16 | Ht 69.0 in | Wt 163.1 lb

## 2023-04-18 DIAGNOSIS — E785 Hyperlipidemia, unspecified: Secondary | ICD-10-CM | POA: Diagnosis not present

## 2023-04-18 DIAGNOSIS — E875 Hyperkalemia: Secondary | ICD-10-CM

## 2023-04-18 DIAGNOSIS — R972 Elevated prostate specific antigen [PSA]: Secondary | ICD-10-CM | POA: Diagnosis not present

## 2023-04-18 DIAGNOSIS — Z7185 Encounter for immunization safety counseling: Secondary | ICD-10-CM | POA: Diagnosis not present

## 2023-04-18 LAB — BASIC METABOLIC PANEL
BUN: 18 mg/dL (ref 6–23)
CO2: 30 meq/L (ref 19–32)
Calcium: 10 mg/dL (ref 8.4–10.5)
Chloride: 102 meq/L (ref 96–112)
Creatinine, Ser: 0.83 mg/dL (ref 0.40–1.50)
GFR: 89.5 mL/min (ref >60.00)
Glucose, Bld: 88 mg/dL (ref 70–99)
Potassium: 5.4 meq/L — ABNORMAL HIGH (ref 3.5–5.1)
Sodium: 140 meq/L (ref 135–145)

## 2023-04-18 LAB — PSA: PSA: 9.49 ng/mL — ABNORMAL HIGH (ref 0.10–4.00)

## 2023-04-18 NOTE — Patient Instructions (Addendum)
Vaccines I recommend: Shingrix (shingles) RSV vaccine Flu shot COVID booster.      GO TO THE LAB : Get the blood work     Next visit with me in 4 months, physical exam. Please schedule it at the front desk

## 2023-04-18 NOTE — Assessment & Plan Note (Signed)
Elevated PSA: 06-2022, PSA increased to 10.2, patient subsequently saw urology, PSA decreased to 1.9 (08/26/2022, [facility]). DRE today is normal, has no symptoms, pt request recheck a PSA.  Will do.  Will also get the urology office visit note. Dyslipidemia: CV RF elevated at 17.2%, he has chosen not to take his statins, benefits discussed again. ASA: Since he is not taking statins, I think aspirin for few years will be helpful.  He agreed.  Will start.   Hyperkalemia: Mild, see last BMP, recheck levels. Vaccine advice: Declines flu shot or COVID-vaccine, pros>cons, recommend to reconsider RTC 4 m

## 2023-04-18 NOTE — Progress Notes (Signed)
   Subjective:    Patient ID: Curtis [name], male    DOB: 02-14-1956, 67 y.o.   MRN: [id number]  DOS:  04/18/2023 Type of visit - description: Routine checkup  PSA was elevated several months ago, would like it to be rechecked. Denies dysuria or gross hematuria.  No difficulty urinating.  Cardiovascular risk is elevated, has decided not to take statins. He remains active, takes long walks, no chest pain or difficulty breathing.  No claudication.   Review of Systems See above   Past Medical History:  Diagnosis Date   COPD (chronic obstructive pulmonary disease) (HCC)    Depression    Multiple rib fractures 10/2016   R side after fall   Neck pain, chronic    Pneumothorax 10/2016   R side, after fall    Past Surgical History:  Procedure Laterality Date   CHEST TUBE INSERTION Right 10/2016   HERNIA REPAIR  2009   right   KNEE SURGERY Right 1990s   arthroscopy    Current Outpatient Medications  Medication Instructions   cholecalciferol (VITAMIN D3) 1,000 Units, Oral, Daily   cyanocobalamin (VITAMIN B12) 500 mcg, Oral, Daily   Multiple Vitamin (MULTIVITAMIN WITH MINERALS) TABS tablet 1 tablet, Oral, Daily   Probiotic Product (PROBIOTIC-10 PO) Oral   venlafaxine XR (EFFEXOR-XR) 37.5 mg, Oral, Daily with breakfast   zinc gluconate 50 mg, Oral, Daily       Objective:   Physical Exam BP 126/68   Pulse 70   Temp 97.9 F (36.6 C) (Oral)   Resp 16   Ht 5\' 9"  (1.753 m)   Wt 163 lb 2 oz (74 kg)   SpO2 98%   BMI 24.09 kg/m  General:   Well developed, NAD, BMI noted. HEENT:  Normocephalic . Face symmetric, atraumatic Lungs:  CTA B Normal respiratory effort, no intercostal retractions, no accessory muscle use. Heart: RRR,  no murmur.  Lower extremities: no pretibial edema bilaterally DRE: Normal sphincter tone, no stools, prostate normal Skin: Not pale. Not jaundice Neurologic:  alert & oriented X3.  Speech normal, gait appropriate for age and  unassisted Psych--  Cognition and judgment appear intact.  Cooperative with normal attention span and concentration.  Behavior appropriate. No anxious or depressed appearing.      Assessment     Assessment  Depression - on effexor Dyslipidemia COPD, smoker. 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  No evidence of any stenosis R PTX 10-2016, traumatic  PLAN Elevated PSA: 06-2022, PSA increased to 10.2, patient subsequently saw urology, PSA decreased to 1.9 (08/26/2022, [facility]). DRE today is normal, has no symptoms, pt request recheck a PSA.  Will do.  Will also get the urology office visit note. Dyslipidemia: CV RF elevated at 17.2%, he has chosen not to take his statins, benefits discussed again. ASA: Since he is not taking statins, I think aspirin for few years will be helpful.  He agreed.  Will start.   Hyperkalemia: Mild, see last BMP, recheck levels. Vaccine advice: Declines flu shot or COVID-vaccine, pros>cons, recommend to reconsider RTC 4 m

## 2023-04-19 ENCOUNTER — Telehealth: Payer: Medicare Other | Admitting: Internal Medicine

## 2023-04-20 ENCOUNTER — Encounter: Payer: Self-pay | Admitting: Internal Medicine

## 2023-04-20 NOTE — Addendum Note (Signed)
Addended byConrad Francis D on: 04/20/2023 01:10 PM   Modules accepted: Orders

## 2023-04-27 ENCOUNTER — Ambulatory Visit: Payer: Medicare Other | Admitting: *Deleted

## 2023-04-27 DIAGNOSIS — Z0000 Encounter for general adult medical examination without abnormal findings: Secondary | ICD-10-CM

## 2023-04-27 NOTE — Patient Instructions (Signed)
[name] , Thank you for taking time to come for your Medicare Wellness Visit. I appreciate your ongoing commitment to your health goals. Please review the following plan we discussed and let me know if I can assist you in the future.     This is a list of the screening recommended for you and due dates:  Health Maintenance  Topic Date Due   Zoster (Shingles) Vaccine (1 of 2) Never done   Screening for Lung Cancer  05/13/2023   Flu Shot  09/26/2023*   Medicare Annual Wellness Visit  04/26/2024   DTaP/Tdap/Td vaccine (3 - Td or Tdap) 06/15/2027   Colon Cancer Screening  02/03/2031   Pneumonia Vaccine  Completed   Hepatitis C Screening  Completed   HPV Vaccine  Aged Out   COVID-19 Vaccine  Discontinued  *Topic was postponed. The date shown is not the original due date.    Next appointment: Follow up in one year for your annual wellness visit.   Preventive Care 65 Years and Older, Male Preventive care refers to lifestyle choices and visits with your health care provider that can promote health and wellness. What does preventive care include? A yearly physical exam. This is also called an annual well check. Dental exams once or twice a year. Routine eye exams. Ask your health care provider how often you should have your eyes checked. Personal lifestyle choices, including: Daily care of your teeth and gums. Regular physical activity. Eating a healthy diet. Avoiding tobacco and drug use. Limiting alcohol use. Practicing safe sex. Taking low doses of aspirin every day. Taking vitamin and mineral supplements as recommended by your health care provider. What happens during an annual well check? The services and screenings done by your health care provider during your annual well check will depend on your age, overall health, lifestyle risk factors, and family history of disease. Counseling  Your health care provider may ask you questions about your: Alcohol use. Tobacco use. Drug  use. Emotional well-being. Home and relationship well-being. Sexual activity. Eating habits. History of falls. Memory and ability to understand (cognition). Work and work Astronomer. Screening  You may have the following tests or measurements: Height, weight, and BMI. Blood pressure. Lipid and cholesterol levels. These may be checked every 5 years, or more frequently if you are over 77 years old. Skin check. Lung cancer screening. You may have this screening every year starting at age 12 if you have a 30-pack-year history of smoking and currently smoke or have quit within the past 15 years. Fecal occult blood test (FOBT) of the stool. You may have this test every year starting at age 74. Flexible sigmoidoscopy or colonoscopy. You may have a sigmoidoscopy every 5 years or a colonoscopy every 10 years starting at age 32. Prostate cancer screening. Recommendations will vary depending on your family history and other risks. Hepatitis C blood test. Hepatitis B blood test. Sexually transmitted disease (STD) testing. Diabetes screening. This is done by checking your blood sugar (glucose) after you have not eaten for a while (fasting). You may have this done every 1-3 years. Abdominal aortic aneurysm (AAA) screening. You may need this if you are a current or former smoker. Osteoporosis. You may be screened starting at age 30 if you are at high risk. Talk with your health care provider about your test results, treatment options, and if necessary, the need for more tests. Vaccines  Your health care provider may recommend certain vaccines, such as: Influenza vaccine. This  is recommended every year. Tetanus, diphtheria, and acellular pertussis (Tdap, Td) vaccine. You may need a Td booster every 10 years. Zoster vaccine. You may need this after age 16. Pneumococcal 13-valent conjugate (PCV13) vaccine. One dose is recommended after age 26. Pneumococcal polysaccharide (PPSV23) vaccine. One dose is  recommended after age 26. Talk to your health care provider about which screenings and vaccines you need and how often you need them. This information is not intended to replace advice given to you by your health care provider. Make sure you discuss any questions you have with your health care provider. Document Released: 07/11/2015 Document Revised: 03/03/2016 Document Reviewed: 04/15/2015 Elsevier Interactive Patient Education  2017 [employer].  Fall Prevention in the Home Falls can cause injuries. They can happen to people of all ages. There are many things you can do to make your home safe and to help prevent falls. What can I do on the outside of my home? Regularly fix the edges of walkways and driveways and fix any cracks. Remove anything that might make you trip as you walk through a door, such as a raised step or threshold. Trim any bushes or trees on the path to your home. Use bright outdoor lighting. Clear any walking paths of anything that might make someone trip, such as rocks or tools. Regularly check to see if handrails are loose or broken. Make sure that both sides of any steps have handrails. Any raised decks and porches should have guardrails on the edges. Have any leaves, snow, or ice cleared regularly. Use sand or salt on walking paths during winter. Clean up any spills in your garage right away. This includes oil or grease spills. What can I do in the bathroom? Use night lights. Install grab bars by the toilet and in the tub and shower. Do not use towel bars as grab bars. Use non-skid mats or decals in the tub or shower. If you need to sit down in the shower, use a plastic, non-slip stool. Keep the floor dry. Clean up any water that spills on the floor as soon as it happens. Remove soap buildup in the tub or shower regularly. Attach bath mats securely with double-sided non-slip rug tape. Do not have throw rugs and other things on the floor that can make you  trip. What can I do in the bedroom? Use night lights. Make sure that you have a light by your bed that is easy to reach. Do not use any sheets or blankets that are too big for your bed. They should not hang down onto the floor. Have a firm chair that has side arms. You can use this for support while you get dressed. Do not have throw rugs and other things on the floor that can make you trip. What can I do in the kitchen? Clean up any spills right away. Avoid walking on wet floors. Keep items that you use a lot in easy-to-reach places. If you need to reach something above you, use a strong step stool that has a grab bar. Keep electrical cords out of the way. Do not use floor polish or wax that makes floors slippery. If you must use wax, use non-skid floor wax. Do not have throw rugs and other things on the floor that can make you trip. What can I do with my stairs? Do not leave any items on the stairs. Make sure that there are handrails on both sides of the stairs and use them. Fix handrails that  are broken or loose. Make sure that handrails are as long as the stairways. Check any carpeting to make sure that it is firmly attached to the stairs. Fix any carpet that is loose or worn. Avoid having throw rugs at the top or bottom of the stairs. If you do have throw rugs, attach them to the floor with carpet tape. Make sure that you have a light switch at the top of the stairs and the bottom of the stairs. If you do not have them, ask someone to add them for you. What else can I do to help prevent falls? Wear shoes that: Do not have high heels. Have rubber bottoms. Are comfortable and fit you well. Are closed at the toe. Do not wear sandals. If you use a stepladder: Make sure that it is fully opened. Do not climb a closed stepladder. Make sure that both sides of the stepladder are locked into place. Ask someone to hold it for you, if possible. Clearly mark and make sure that you can  see: Any grab bars or handrails. First and last steps. Where the edge of each step is. Use tools that help you move around (mobility aids) if they are needed. These include: Canes. Walkers. Scooters. Crutches. Turn on the lights when you go into a dark area. Replace any light bulbs as soon as they burn out. Set up your furniture so you have a clear path. Avoid moving your furniture around. If any of your floors are uneven, fix them. If there are any pets around you, be aware of where they are. Review your medicines with your doctor. Some medicines can make you feel dizzy. This can increase your chance of falling. Ask your doctor what other things that you can do to help prevent falls. This information is not intended to replace advice given to you by your health care provider. Make sure you discuss any questions you have with your health care provider. Document Released: 04/10/2009 Document Revised: 11/20/2015 Document Reviewed: 07/19/2014 Elsevier Interactive Patient Education  2017 [employer].

## 2023-04-27 NOTE — Progress Notes (Signed)
Subjective:   Curtis [name] is a 67 y.o. male who presents for Medicare Annual/Subsequent preventive examination.  Visit Complete: Virtual I connected with  Kirkland Hun on 04/27/23 by a audio enabled telemedicine application and verified that I am speaking with the correct person using two identifiers.  Patient Location: Home  Provider Location: Office/Clinic  I discussed the limitations of evaluation and management by telemedicine. The patient expressed understanding and agreed to proceed.  Vital Signs: Because this visit was a virtual/telehealth visit, some criteria may be missing or patient reported. Any vitals not documented were not able to be obtained and vitals that have been documented are patient reported.   Cardiac Risk Factors include: advanced age (>34men, >43 women);male gender;dyslipidemia     Objective:    There were no vitals filed for this visit. There is no height or weight on file to calculate BMI.     04/27/2023   11:08 AM 03/08/2022    1:08 PM 10/29/2016   10:20 PM  Advanced Directives  Does Patient Have a Medical Advance Directive? No No No  Would patient like information on creating a medical advance directive? No - Patient declined  No - Patient declined    Current Medications (verified) Outpatient Encounter Medications as of 04/27/2023  Medication Sig   aspirin EC 81 MG tablet Take 81 mg by mouth daily. Swallow whole.   cholecalciferol (VITAMIN D3) 25 MCG (1000 UNIT) tablet Take 1,000 Units by mouth daily.   Multiple Vitamin (MULTIVITAMIN WITH MINERALS) TABS tablet Take 1 tablet by mouth daily.   Probiotic Product (PROBIOTIC-10 PO) Take by mouth.   venlafaxine XR (EFFEXOR-XR) 37.5 MG 24 hr capsule Take 1 capsule (37.5 mg total) by mouth daily with breakfast.   vitamin B-12 (CYANOCOBALAMIN) 500 MCG tablet Take 500 mcg by mouth daily.   zinc gluconate 50 MG tablet Take 50 mg by mouth daily.   No facility-administered encounter medications on  file as of 04/27/2023.    Allergies (verified) Patient has no known allergies.   History: Past Medical History:  Diagnosis Date   COPD (chronic obstructive pulmonary disease) (HCC)    Depression    Multiple rib fractures 10/2016   R side after fall   Neck pain, chronic    Pneumothorax 10/2016   R side, after fall   Past Surgical History:  Procedure Laterality Date   CHEST TUBE INSERTION Right 10/2016   HERNIA REPAIR  2009   right   KNEE SURGERY Right 1990s   arthroscopy   Family History  Problem Relation Age of Onset   Cancer Mother        skin   Breast cancer Mother 67   COPD Father    Diabetes Father    Stroke Father 66   Alcohol abuse Son    Colon cancer Neg Hx    Prostate cancer Neg Hx    CAD Neg Hx        MGF?   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    Employer: [employer]  Tobacco Use   Smoking status: Former    Current packs/day: 0.00    Average packs/day: 1.3 packs/day for 40.0 years (52.0 ttl pk-yrs)    Types: Cigarettes, E-cigarettes  Start date: 36    Quit date: 2016    Years since quitting: 8.8   Smokeless tobacco: Never   Tobacco comments:    quit cigarrets, vapes since ~ 05/2017  Vaping Use   Vaping status: Former  Substance and Sexual Activity   Alcohol use: Yes    Comment: socially    Drug use: No   Sexual activity: Not on file  Other Topics Concern   Not on file  Social History Narrative   Lives w/ wife   4 children   --1 son h/o of substance abuse Kathlene November) , he lives w/ them   --1 son is a Teacher, early years/pre    --Daughter Rexene Alberts NP for ID, 2 children   --Daughter Jasmine December, 1 child             Social Determinants of Health   Financial Resource Strain: Low Risk  (04/27/2023)   Overall Financial Resource Strain  (CARDIA)    Difficulty of Paying Living Expenses: Not hard at all  Food Insecurity: No Food Insecurity (04/27/2023)   Hunger Vital Sign    Worried About Running Out of Food in the Last Year: Never true    Ran Out of Food in the Last Year: Never true  Transportation Needs: No Transportation Needs (04/27/2023)   PRAPARE - Administrator, Civil Service (Medical): No    Lack of Transportation (Non-Medical): No  Physical Activity: Sufficiently Active (04/27/2023)   Exercise Vital Sign    Days of Exercise per Week: 7 days    Minutes of Exercise per Session: 40 min  Stress: No Stress Concern Present (04/27/2023)   [employer] of Occupational Health - Occupational Stress Questionnaire    Feeling of Stress : Not at all  Social Connections: Moderately Isolated (04/27/2023)   Social Connection and Isolation Panel [NHANES]    Frequency of Communication with Friends and Family: More than three times a week    Frequency of Social Gatherings with Friends and Family: More than three times a week    Attends Religious Services: Never    Database administrator or Organizations: No    Attends Banker Meetings: Never    Marital Status: Married    Tobacco Counseling Counseling given: Not Answered Tobacco comments: quit cigarrets, vapes since ~ 05/2017   Clinical Intake:  Pre-visit preparation completed: Yes  Pain : No/denies pain  Nutritional Risks: None Diabetes: No  How often do you need to have someone help you when you read instructions, pamphlets, or other written materials from your doctor or pharmacy?: 1 - Never  Interpreter Needed?: No  Information entered by :: Donne Anon, CMA   Activities of Daily Living    04/27/2023   11:02 AM  In your present state of health, do you have any difficulty performing the following activities:  Hearing? 0  Vision? 0  Difficulty concentrating or making decisions? 0  Walking or climbing stairs? 0  Dressing or  bathing? 0  Doing errands, shopping? 0  Preparing Food and eating ? N  Using the Toilet? N  In the past six months, have you accidently leaked urine? Y  Comment "just once"  Do you have problems with loss of bowel control? N  Managing your Medications? N  Managing your Finances? N  Housekeeping or managing your Housekeeping? N    Patient Care Team: [name], MD as PCP - Ricky Stabs, MD as Consulting Physician (Gastroenterology)  Indicate any recent Medical  Services you may have received from other than [facility] providers in the past year (date may be approximate).     Assessment:   This is a routine wellness examination for Anuj.  Hearing/Vision screen No results found.   Goals Addressed             This Visit's Progress    Patient Stated   On track    Continue eating healthy & walking       Depression Screen    04/27/2023   11:08 AM 04/18/2023   11:22 AM 07/26/2022   10:05 AM 03/08/2022    1:13 PM 07/23/2021    9:32 AM 01/19/2021    9:08 AM 01/19/2021    8:32 AM  PHQ 2/9 Scores  PHQ - 2 Score 0 0 0 0 0 0 0  PHQ- 9 Score  2 0  0 2     Fall Risk    04/27/2023   11:06 AM 04/18/2023   10:57 AM 07/26/2022   10:05 AM 03/08/2022    1:11 PM 07/23/2021    9:32 AM  Fall Risk   Falls in the past year? 0 0 0 0 0  Number falls in past yr: 0 0 0 0 0  Injury with Fall? 0 0 0 0 0  Risk for fall due to : No Fall Risks      Follow up Falls evaluation completed Falls evaluation completed Falls evaluation completed Falls prevention discussed Falls evaluation completed    MEDICARE RISK AT HOME: Medicare Risk at Home Any stairs in or around the home?: Yes If so, are there any without handrails?: No Home free of loose throw rugs in walkways, pet beds, electrical cords, etc?: Yes Adequate lighting in your home to reduce risk of falls?: Yes Life alert?: No Use of a cane, walker or w/c?: No Grab bars in the bathroom?: No Shower chair or bench in shower?: Yes  (built in seat) Elevated toilet seat or a handicapped toilet?: No  TIMED UP AND GO:  Was the test performed?  No    Cognitive Function:        04/27/2023   11:16 AM 03/08/2022    1:19 PM  6CIT Screen  What Year? 0 points 0 points  What month? 0 points 0 points  What time? 0 points 0 points  Count back from 20 0 points 0 points  Months in reverse 0 points 0 points  Repeat phrase 0 points 0 points  Total Score 0 points 0 points    Immunizations Immunization History  Administered Date(s) Administered   Fluad Quad(high Dose 65+) 06/20/2019   Influenza,inj,Quad PF,6+ Mos 05/02/2013, 03/22/2014, 03/28/2015, 05/25/2016, 06/14/2017   Influenza-Unspecified 05/28/2018   PNEUMOCOCCAL CONJUGATE-20 07/23/2021   Pneumococcal Conjugate-13 03/28/2015   Pneumococcal Polysaccharide-23 05/31/2011   Td 03/31/2007   Tdap 06/14/2017    TDAP status: Up to date  Flu Vaccine status: Declined, Education has been provided regarding the importance of this vaccine but patient still declined. Advised may receive this vaccine at local pharmacy or Health Dept. Aware to provide a copy of the vaccination record if obtained from local pharmacy or Health Dept. Verbalized acceptance and understanding.  Pneumococcal vaccine status: Up to date  Covid-19 vaccine status: Declined, Education has been provided regarding the importance of this vaccine but patient still declined. Advised may receive this vaccine at local pharmacy or Health Dept.or vaccine clinic. Aware to provide a copy of the vaccination record if obtained from local pharmacy or Health  Dept. Verbalized acceptance and understanding.  Qualifies for Shingles Vaccine? Yes   Zostavax completed No   Shingrix Completed?: No.    Education has been provided regarding the importance of this vaccine. Patient has been advised to call insurance company to determine out of pocket expense if they have not yet received this vaccine. Advised may also receive  vaccine at local pharmacy or Health Dept. Verbalized acceptance and understanding.  Screening Tests Health Maintenance  Topic Date Due   Zoster Vaccines- Shingrix (1 of 2) Never done   Medicare Annual Wellness (AWV)  03/09/2023   Lung Cancer Screening  05/13/2023   INFLUENZA VACCINE  09/26/2023 (Originally 01/27/2023)   DTaP/Tdap/Td (3 - Td or Tdap) 06/15/2027   Colonoscopy  02/03/2031   Pneumonia Vaccine 54+ Years old  Completed   Hepatitis C Screening  Completed   HPV VACCINES  Aged Out   COVID-19 Vaccine  Discontinued    Health Maintenance  Health Maintenance Due  Topic Date Due   Zoster Vaccines- Shingrix (1 of 2) Never done   Medicare Annual Wellness (AWV)  03/09/2023   Lung Cancer Screening  05/13/2023    Colorectal cancer screening: Type of screening: Colonoscopy. Completed 02/02/21. Repeat every 10 years  Lung Cancer Screening: (Low Dose CT Chest recommended if Age 72-80 years, 20 pack-year currently smoking OR have quit w/in 15years.) does qualify.   Lung Cancer Screening Referral: next scan scheduled for 05/16/23  Additional Screening:  Hepatitis C Screening: does qualify; Completed 10/20/15  Vision Screening: Recommended annual ophthalmology exams for early detection of glaucoma and other disorders of the eye. Is the patient up to date with their annual eye exam?  Yes  Who is the provider or what is the name of the office in which the patient attends annual eye exams? [facility] If pt is not established with a provider, would they like to be referred to a provider to establish care? No .   Dental Screening: Recommended annual dental exams for proper oral hygiene  Diabetic Foot Exam: N/a  Community Resource Referral / Chronic Care Management: CRR required this visit?  No   CCM required this visit?  No     Plan:     I have personally reviewed and noted the following in the patient's chart:   Medical and social history Use of alcohol, tobacco or illicit drugs   Current medications and supplements including opioid prescriptions. Patient is not currently taking opioid prescriptions. Functional ability and status Nutritional status Physical activity Advanced directives List of other physicians Hospitalizations, surgeries, and ER visits in previous 12 months Vitals Screenings to include cognitive, depression, and falls Referrals and appointments  In addition, I have reviewed and discussed with patient certain preventive protocols, quality metrics, and best practice recommendations. A written personalized care plan for preventive services as well as general preventive health recommendations were provided to patient.     Donne Anon, CMA   04/27/2023   After Visit Summary: (MyChart) Due to this being a telephonic visit, the after visit summary with patients personalized plan was offered to patient via MyChart   Nurse Notes: None

## 2023-05-16 ENCOUNTER — Ambulatory Visit (HOSPITAL_BASED_OUTPATIENT_CLINIC_OR_DEPARTMENT_OTHER)
Admission: RE | Admit: 2023-05-16 | Discharge: 2023-05-16 | Disposition: A | Discharge status: Home / Self Care | Payer: Medicare Other | Source: Ambulatory Visit | Attending: Acute Care | Admitting: Acute Care

## 2023-05-16 DIAGNOSIS — Z87891 Personal history of nicotine dependence: Secondary | ICD-10-CM | POA: Insufficient documentation

## 2023-05-16 DIAGNOSIS — Z122 Encounter for screening for malignant neoplasm of respiratory organs: Secondary | ICD-10-CM | POA: Insufficient documentation

## 2023-05-21 ENCOUNTER — Other Ambulatory Visit: Payer: Self-pay | Admitting: Internal Medicine

## 2023-05-22 ENCOUNTER — Other Ambulatory Visit: Payer: Self-pay | Admitting: Medical Genetics

## 2023-05-22 ENCOUNTER — Encounter (HOSPITAL_COMMUNITY): Payer: Self-pay

## 2023-05-22 ENCOUNTER — Emergency Department (HOSPITAL_COMMUNITY): Payer: Medicare Other

## 2023-05-22 ENCOUNTER — Emergency Department (HOSPITAL_COMMUNITY)
Admission: EM | Admit: 2023-05-22 | Discharge: 2023-05-22 | Disposition: A | Discharge status: Home / Self Care | Payer: Medicare Other | Attending: Emergency Medicine | Admitting: Emergency Medicine

## 2023-05-22 ENCOUNTER — Other Ambulatory Visit: Payer: Self-pay

## 2023-05-22 DIAGNOSIS — E785 Hyperlipidemia, unspecified: Secondary | ICD-10-CM | POA: Diagnosis not present

## 2023-05-22 DIAGNOSIS — W19XXXA Unspecified fall, initial encounter: Secondary | ICD-10-CM

## 2023-05-22 DIAGNOSIS — M542 Cervicalgia: Secondary | ICD-10-CM | POA: Insufficient documentation

## 2023-05-22 DIAGNOSIS — S0990XA Unspecified injury of head, initial encounter: Secondary | ICD-10-CM | POA: Diagnosis present

## 2023-05-22 DIAGNOSIS — R519 Headache, unspecified: Secondary | ICD-10-CM | POA: Insufficient documentation

## 2023-05-22 DIAGNOSIS — R402412 Glasgow coma scale score 13-15, at arrival to emergency department: Secondary | ICD-10-CM | POA: Diagnosis not present

## 2023-05-22 DIAGNOSIS — F32A Depression, unspecified: Secondary | ICD-10-CM | POA: Insufficient documentation

## 2023-05-22 DIAGNOSIS — G8929 Other chronic pain: Secondary | ICD-10-CM | POA: Diagnosis not present

## 2023-05-22 DIAGNOSIS — Z7982 Long term (current) use of aspirin: Secondary | ICD-10-CM | POA: Insufficient documentation

## 2023-05-22 DIAGNOSIS — S060X1A Concussion with loss of consciousness of 30 minutes or less, initial encounter: Secondary | ICD-10-CM | POA: Diagnosis not present

## 2023-05-22 DIAGNOSIS — M47812 Spondylosis without myelopathy or radiculopathy, cervical region: Secondary | ICD-10-CM | POA: Insufficient documentation

## 2023-05-22 DIAGNOSIS — J449 Chronic obstructive pulmonary disease, unspecified: Secondary | ICD-10-CM | POA: Insufficient documentation

## 2023-05-22 DIAGNOSIS — M545 Low back pain, unspecified: Secondary | ICD-10-CM | POA: Insufficient documentation

## 2023-05-22 DIAGNOSIS — Z006 Encounter for examination for normal comparison and control in clinical research program: Secondary | ICD-10-CM

## 2023-05-22 DIAGNOSIS — J439 Emphysema, unspecified: Secondary | ICD-10-CM | POA: Diagnosis not present

## 2023-05-22 DIAGNOSIS — W1789XA Other fall from one level to another, initial encounter: Secondary | ICD-10-CM | POA: Insufficient documentation

## 2023-05-22 DIAGNOSIS — I700 Atherosclerosis of aorta: Secondary | ICD-10-CM | POA: Insufficient documentation

## 2023-05-22 DIAGNOSIS — S0003XA Contusion of scalp, initial encounter: Secondary | ICD-10-CM | POA: Diagnosis not present

## 2023-05-22 LAB — CBC WITH DIFFERENTIAL/PLATELET
Abs Immature Granulocytes: 0.03 10*3/uL (ref 0.00–0.07)
Basophils Absolute: 0 10*3/uL (ref 0.0–0.1)
Basophils Relative: 1 %
Eosinophils Absolute: 0.1 10*3/uL (ref 0.0–0.5)
Eosinophils Relative: 1 %
HCT: 44.2 % (ref 39.0–52.0)
Hemoglobin: 14.3 g/dL (ref 13.0–17.0)
Immature Granulocytes: 1 %
Lymphocytes Relative: 23 %
Lymphs Abs: 1.4 10*3/uL (ref 0.7–4.0)
MCH: 29.1 pg (ref 26.0–34.0)
MCHC: 32.4 g/dL (ref 30.0–36.0)
MCV: 89.8 fL (ref 80.0–100.0)
Monocytes Absolute: 0.4 10*3/uL (ref 0.1–1.0)
Monocytes Relative: 6 %
Neutro Abs: 4 10*3/uL (ref 1.7–7.7)
Neutrophils Relative %: 68 %
Platelets: 114 10*3/uL — ABNORMAL LOW (ref 150–400)
RBC: 4.92 MIL/uL (ref 4.22–5.81)
RDW: 13.6 % (ref 11.5–15.5)
WBC: 5.9 10*3/uL (ref 4.0–10.5)
nRBC: 0 % (ref 0.0–0.2)

## 2023-05-22 LAB — COMPREHENSIVE METABOLIC PANEL
ALT: 23 U/L (ref 0–44)
AST: 30 U/L (ref 15–41)
Albumin: 3.9 g/dL (ref 3.5–5.0)
Alkaline Phosphatase: 48 U/L (ref 38–126)
Anion gap: 11 (ref 5–15)
BUN: 19 mg/dL (ref 8–23)
CO2: 23 mmol/L (ref 22–32)
Calcium: 9.5 mg/dL (ref 8.9–10.3)
Chloride: 108 mmol/L (ref 98–111)
Creatinine, Ser: 0.96 mg/dL (ref 0.61–1.24)
GFR, Estimated: >60 mL/min (ref >60)
Glucose, Bld: 78 mg/dL (ref 70–99)
Potassium: 3.6 mmol/L (ref 3.5–5.1)
Sodium: 142 mmol/L (ref 135–145)
Total Bilirubin: 0.6 mg/dL (ref <1.2)
Total Protein: 6.2 g/dL — ABNORMAL LOW (ref 6.5–8.1)

## 2023-05-22 LAB — TYPE AND SCREEN
ABO/RH(D): A POS
Antibody Screen: NEGATIVE

## 2023-05-22 LAB — ETHANOL: Alcohol, Ethyl (B): <10 mg/dL (ref <10)

## 2023-05-22 NOTE — ED Notes (Signed)
Pt log rolled at this time with EDP

## 2023-05-22 NOTE — ED Triage Notes (Signed)
Pt BIB EMS for a fall and had a 4 ft fall at home, fell backwards and hit back of head. GCS 14 and repetitive questioning. Bo blood thinners. Pt arrives in c-collar and hematoma on left side of back of head.

## 2023-05-22 NOTE — ED Notes (Signed)
RN transported pt to CT

## 2023-05-22 NOTE — ED Provider Notes (Signed)
[name] Provider Note   CSN: [id number] Arrival date & time: 05/22/23  1748     History  Chief Complaint  Patient presents with   level 2 fall   Fall    Curtis [name] is a 67 y.o. male with a PMH of HLD, chronic neck pain, COPD, depression who presented to the ED for a fall.  Per EMS, patient had a fall from a deck balcony that is approximately 4 feet off the ground.  Patient reports he was working the backyard when he fell.  Endorses hitting his head and loss of consciousness.  Reports he remembers waking up on the EMS stretcher.  Wife at bedside reports they were digging along a retaining wall, and she thinks the patient stood up too fast and he fell with loss of consciousness afterwards.  EMS reports patient has been stable with a GCS of 14 due to repetitive questioning.  Patient endorses left-sided head pain but denies chest pain, abdominal pain, or pain in any of his extremities.  Denies taking blood thinners or using alcohol tonight.  Fall       Home Medications Prior to Admission medications   Medication Sig Start Date End Date Taking? Authorizing Provider  aspirin EC 81 MG tablet Take 81 mg by mouth daily. Swallow whole.    [provider]  cholecalciferol (VITAMIN D3) 25 MCG (1000 UNIT) tablet Take 1,000 Units by mouth daily.    [provider]  Multiple Vitamin (MULTIVITAMIN WITH MINERALS) TABS tablet Take 1 tablet by mouth daily.    [provider]  Probiotic Product (PROBIOTIC-10 PO) Take by mouth.    [provider]  venlafaxine XR (EFFEXOR-XR) 37.5 MG 24 hr capsule Take 1 capsule (37.5 mg total) by mouth daily with breakfast. 11/25/22   [name], MD  vitamin B-12 (CYANOCOBALAMIN) 500 MCG tablet Take 500 mcg by mouth daily.    [provider]  zinc gluconate 50 MG tablet Take 50 mg by mouth daily.    [provider]      Allergies    Patient has no known  allergies.    Review of Systems   Review of Systems  Physical Exam Updated Vital Signs BP 131/87   Pulse 66   Temp 98.1 F (36.7 C) (Oral)   Resp 19   Ht 5\' 9"  (1.753 m)   Wt 74.8 kg   SpO2 100%   BMI 24.37 kg/m  Physical Exam Constitutional:      General: He is not in acute distress.    Appearance: Normal appearance. He is not ill-appearing.  HENT:     Head: Normocephalic.     Comments: Large left parietal scalp hematoma with small superficial abrasion overlying    Nose: Nose normal.     Mouth/Throat:     Mouth: Mucous membranes are moist.     Pharynx: Oropharynx is clear.  Eyes:     Pupils: Pupils are equal, round, and reactive to light.  Neck:     Comments: C-collar in place.  No midline C, T, L-spine tenderness to palpation Cardiovascular:     Rate and Rhythm: Normal rate and regular rhythm.  Pulmonary:     Effort: Pulmonary effort is normal.     Breath sounds: Normal breath sounds. No stridor. No wheezing, rhonchi or rales.  Abdominal:     Palpations: Abdomen is soft.     Tenderness: There is no abdominal tenderness. There is  no guarding or rebound.  Musculoskeletal:     Right lower leg: No edema.     Left lower leg: No edema.     Comments: All 4 extremities grossly atraumatic.  Bilateral radial and DP pulses 2+  Skin:    General: Skin is warm and dry.  Neurological:     General: No focal deficit present.     Mental Status: He is alert.     Comments: Sensation and motor function intact in all 4 extremities     ED Results / Procedures / Treatments   Labs (all labs ordered are listed, but only abnormal results are displayed) Labs Reviewed  CBC WITH DIFFERENTIAL/PLATELET - Abnormal; Notable for the following components:      Result Value   Platelets 114 (*)    All other components within normal limits  COMPREHENSIVE METABOLIC PANEL - Abnormal; Notable for the following components:   Total Protein 6.2 (*)    All other components within normal limits   ETHANOL  TYPE AND SCREEN    EKG EKG Interpretation Date/Time:  Sunday May 22 2023 18:10:27 EST Ventricular Rate:  58 PR Interval:  162 QRS Duration:  117 QT Interval:  446 QTC Calculation: 439 R Axis:   15  Text Interpretation: Sinus rhythm Nonspecific intraventricular conduction delay similar to 2009 Confirmed by Pricilla Loveless [phone]) on 05/22/2023 6:43:21 PM  Radiology CT Cervical Spine Wo Contrast  Result Date: 05/22/2023 CLINICAL DATA:  Neck trauma EXAM: CT CERVICAL SPINE WITHOUT CONTRAST TECHNIQUE: Multidetector CT imaging of the cervical spine was performed without intravenous contrast. Multiplanar CT image reconstructions were also generated. RADIATION DOSE REDUCTION: This exam was performed according to the departmental dose-optimization program which includes automated exposure control, adjustment of the mA and/or kV according to patient size and/or use of iterative reconstruction technique. COMPARISON:  MRI 04/24/2003 FINDINGS: Alignment: Straightening of the cervical spine. Facet alignment is within normal limits. Trace anterolisthesis C4 on C5. Trace retrolisthesis C5 on C6 and C6 on C7. Facet alignment within normal limits Skull base and vertebrae: No acute fracture. No primary bone lesion or focal pathologic process. Soft tissues and spinal canal: No prevertebral fluid or swelling. No visible canal hematoma. Disc levels: Advanced disc space narrowing and degenerative change C3-C4, C5-C6 and C6-C7. Facet degenerative changes at multiple levels with foraminal narrowing Upper chest: Emphysema Other: None IMPRESSION: Straightening of the cervical spine with multilevel degenerative changes. No acute osseous abnormality. Emphysema Electronically Signed   By: Jasmine Pang M.D.   On: 05/22/2023 19:14   CT Head Wo Contrast  Result Date: 05/22/2023 CLINICAL DATA:  Head trauma fell at home EXAM: CT HEAD WITHOUT CONTRAST TECHNIQUE: Contiguous axial images were obtained from the  base of the skull through the vertex without intravenous contrast. RADIATION DOSE REDUCTION: This exam was performed according to the departmental dose-optimization program which includes automated exposure control, adjustment of the mA and/or kV according to patient size and/or use of iterative reconstruction technique. COMPARISON:  None Available. FINDINGS: Brain: No acute territorial infarction, hemorrhage or intracranial mass. Mild atrophy. Prominent ventricular size probably related to atrophy. Vascular: No hyperdense vessels.  No unexpected calcification Skull: Normal. Negative for fracture or focal lesion. Sinuses/Orbits: No acute finding. Other: Moderate left parietal scalp hematoma IMPRESSION: 1. No CT evidence for acute intracranial abnormality. 2. Moderate left parietal scalp hematoma. Mild atrophy. Electronically Signed   By: Jasmine Pang M.D.   On: 05/22/2023 19:09   DG Chest Portable 1 View  Result Date: 05/22/2023  CLINICAL DATA:  Fall EXAM: PORTABLE CHEST 1 VIEW COMPARISON:  11/10/2016, chest CT 05/16/2023, 05/12/2022 FINDINGS: No acute airspace disease or pleural effusion. Normal cardiomediastinal silhouette with aortic atherosclerosis. No pneumothorax. Multiple chronic right rib fractures. IMPRESSION: No active disease. Multiple chronic right rib fractures. Electronically Signed   By: Jasmine Pang M.D.   On: 05/22/2023 19:06    Procedures Procedures    Medications Ordered in ED Medications - No data to display  ED Course/ Medical Decision Making/ A&P                                 Medical Decision Making Amount and/or Complexity of Data Reviewed Labs: ordered. Radiology: ordered.   Patient evaluated per ATLS protocol.  He has no focal deficits on exam, he has a decently large left-sided scalp hematoma without laceration.  CT head, C-spine, chest x-ray negative for acute traumatic injury.  CBC with no leukocytosis or anemia.  Metabolic panel with no gross metabolic or  electrolyte abnormality.  EtOH negative.  C-collar was cleared in the ED.  Patient reevaluated and is no longer confused, wife and daughter at bedside report he is acting at his baseline.  He reports he is feeling well and was ambulatory with a steady gait in the ED.  I discussed signs and symptoms of concussion with the patient and his daughter at the bedside.  Encouraged him to follow-up with his PCP and discussed the results of his laboratory and imaging evaluations today.  Return precautions were discussed.  Patient was discharged in stable condition.        Final Clinical Impression(s) / ED Diagnoses Final diagnoses:  Fall, initial encounter    Rx / DC Orders ED Discharge Orders     None         Janyth Pupa, MD 05/22/23 [id number]    Pricilla Loveless, MD 05/22/23 [id number]

## 2023-05-22 NOTE — ED Notes (Signed)
C-collar changed to [place] collar

## 2023-05-22 NOTE — Progress Notes (Signed)
Orthopedic Tech Progress Note Patient Details:  Curtis [name] 10-Sep-1955 [id number]  Level II trauma, no ortho tech needs at this time  Patient ID: Curtis [name], male   DOB: 05/06/1956, 67 y.o.   MRN: [id number]  Docia Furl 05/22/2023, 6:56 PM

## 2023-05-22 NOTE — Progress Notes (Signed)
   05/22/23 1825  Spiritual Encounters  Type of Visit Initial  Care provided to: Pt and family  Conversation partners present during encounter Nurse  Referral source Trauma page  Reason for visit Trauma  OnCall Visit Yes  Spiritual Framework  Presenting Themes Impactful experiences and emotions  Patient Stress Factors Not reviewed  Family Stress Factors Not reviewed  Interventions  Spiritual Care Interventions Made Established relationship of care and support;Compassionate presence;Reflective listening;Normalization of emotions  Intervention Outcomes  Outcomes Connection to spiritual care;Awareness of support;Reduced anxiety;Reduced isolation  Spiritual Care Plan  Spiritual Care Issues Still Outstanding No further spiritual care needs at this time (see row info)   Patient came in as a level 2 trauma. Patient let medical staff know that his wife would be arriving at the ED. Medical staff let chaplain know she could find the Patients' wife and escort her to the patient's room. Chaplain met wife and provided compassionate presence and reflective listening. Chaplain led wife to the patient's room. No further needs at this time.   Arlyce Dice, Chaplain Resident [phone]

## 2023-05-22 NOTE — Discharge Instructions (Signed)
You were seen here today for a fall.  Your workup showed no skull, neck fractures or bleeding on your brain.  Please use ice and Tylenol as needed for pain.  Please follow-up with your PCP.  Please return to the emergency department for abnormal behavior, chest pain, shortness of breath, severe vomiting or inability to keep down food, loss of consciousness, or any worsening symptom or concern.

## 2023-05-22 NOTE — ED Notes (Signed)
CT does not have a room at this time

## 2023-05-22 NOTE — ED Notes (Signed)
Pt ambulated in the hall. Pt able to walk independently and has a steady gait.

## 2023-05-25 ENCOUNTER — Encounter: Payer: Self-pay | Admitting: Internal Medicine

## 2023-05-25 ENCOUNTER — Ambulatory Visit: Payer: Medicare Other | Admitting: Internal Medicine

## 2023-05-25 VITALS — BP 120/74 | HR 74 | Temp 98.0°F | Resp 16 | Ht 69.0 in | Wt 164.2 lb

## 2023-05-25 DIAGNOSIS — S060X0D Concussion without loss of consciousness, subsequent encounter: Secondary | ICD-10-CM

## 2023-05-25 DIAGNOSIS — W19XXXD Unspecified fall, subsequent encounter: Secondary | ICD-10-CM | POA: Diagnosis not present

## 2023-05-25 NOTE — Patient Instructions (Signed)
Head Injury, Adult    There are many types of head injuries. They can be as minor as a small bump. Some head injuries can be worse. Worse injuries include:  A strong hit to the head that shakes the brain back and forth, causing damage (concussion).  A bruise (contusion) of the brain. This means there is bleeding in the brain that can cause swelling.  A cracked skull (skull fracture).  Bleeding in the brain that gathers, gets thick (makes a clot), and forms a bump (hematoma).  Most problems from a head injury come in the first 24 hours. However, you may still have side effects up to 7-10 days after your injury. It is important to watch your condition for any changes. You may need to be watched in the emergency department or urgent care, or you may need to stay in the hospital.  What are the causes?  There are many possible causes of a head injury. A serious head injury may be caused by:  A car accident.  Bicycle or motorcycle accidents.  Sports injuries.  Falls.  Being hit by an object.  What are the signs or symptoms?  Symptoms of a head injury include a bruise, bump, or bleeding where the injury happened. Other physical symptoms may include:  Headache.  Feeling like you may vomit (nauseous) or vomiting.  Dizziness.  Blurred or double vision.  Being uncomfortable around bright lights or loud noises.  Feeling tired.  Trouble waking up.  Severe symptoms such as:  Feeling weak or numb on one side of the body.  Slurred speech.  Swallowing problems.  Fainting.  Shaking movements that you cannot control (seizures).  Mental or emotional symptoms may include:  Feeling grumpy or cranky.  Confusion and memory problems.  Having trouble paying attention or concentrating.  Changes in eating or sleeping habits.  Feeling worried or nervous (anxious).  Feeling sad (depressed).  How is this treated?  Treatment for this condition depends on how bad the injury is and the type of injury you have. The main goal is to prevent problems  and to allow the brain time to heal.  Mild head injury  If you have a mild head injury, you may be sent home, and treatment may include:  Being watched. A responsible adult should stay with you for 24 hours after your injury and check on you often.  Physical rest.  Brain rest.  Pain medicines.  Very bad head injury  If you have a very bad head injury, treatment may include:  Being watched closely. This includes staying in the hospital.  Medicines to:  Help with pain.  Prevent seizures.  Help with brain swelling.  Protecting your airway and using a machine that helps you breathe (ventilator).  Watching for and manage swelling inside the brain.  Brain surgery. This may be needed to:  Remove a collection of blood or blood clots.  Stop the bleeding.  Remove a part of the skull. This allows room for the brain to swell.  Follow these instructions at home:  Activity  Rest.  Avoid activities that are hard or tiring.  Make sure you get enough sleep.  Let your brain rest. Do fewer activities that need a lot of thought or attention, such as:  Watching TV.  Playing memory games and doing puzzles.  Job-related work or homework.  Working on [employer], and texting.  Avoid activities that could cause another head injury until your doctor says it is  okay. This includes playing sports.  Ask your doctor when it is safe for you to go back to your normal activities, such as work or school.  Ask your doctor when you can drive, ride a bicycle, or use machines. Do not do these activities if you are dizzy.  Lifestyle    Do not drink alcohol until your doctor says it is okay.  Do not use drugs.  If it is hard to remember things, write them down.  If you are easily distracted, try to do one thing at a time.  Talk with family members or close friends when making important decisions.  Tell your friends, family, a trusted co-worker, and work Production designer, theatre/television/film about your injury, symptoms, and limits (restrictions). Have them watch for any  problems that are new or getting worse.  General instructions  Take over-the-counter and prescription medicines only as told by your doctor.  Have a responsible adult stay with you for 24 hours after your head injury. This person should watch you for any changes in your symptoms and be ready to get help.  Keep all follow-up visits to catch any new problems early.  How is this prevented?  Having another head injury can be dangerous. Another injury can lead to brain damage, brain swelling, or death. You can avoid this by:  Working on Therapist, music. This can help you avoid falls.  Wearing a seat belt when you are in a moving vehicle.  Wearing a helmet when you:  Ride a bicycle.  Ski.  Do any other sport or activity that has a risk of injury.  Making your home safer by:  Getting rid of clutter from the floors and stairs. This includes things that can make you trip.  Using grab bars in bathrooms and handrails by stairs.  Placing non-slip mats on floors and in bathtubs.  Putting more light in dim areas.  Where to find more information  Brain Injury Association: biausa.org  Contact a doctor if:  These symptoms do not go away:  Headaches.  Dizziness.  Double vision or vision changes.  Trouble sleeping.  Changes in mood.  You have new symptoms.  Get help right away if:  You have sudden:  Headache that is very bad.  Vomiting that does not stop.  Changes in the size of one of your pupils. Pupils are the black centers of your eyes.  Changes in how you see (vision).  More confusion or more grumpy moods.  You have a seizure.  Your symptoms get worse.  You have a clear or bloody fluid coming from your nose or ears.  These symptoms may be an emergency. Get help right away. Call 911.  Do not wait to see if the symptoms will go away.  Do not drive yourself to the hospital.  This information is not intended to replace advice given to you by your health care provider. Make sure you discuss any questions you have with your  health care provider.  Document Revised: 04/01/2022 Document Reviewed: 04/01/2022  Elsevier Patient Education  2024 [employer].

## 2023-05-25 NOTE — Progress Notes (Unsigned)
Subjective:    Patient ID: Curtis [name], male    DOB: Oct 31, 1955, 67 y.o.   MRN: [id number]  DOS:  05/25/2023 Type of visit - description: ER follow-up  05/22/2023 The patient had a fall from a deck balcony approximately 4 feet off the ground. + Head injury, + LOC. Was initially confused. Arrived to the ER via EMS.  CT cervical spine, head: Negative. Chest x-ray with no acute fractures  Had a left-sided scalp hematoma with no laceration.  He is here with his wife for follow-up. Since the incident he is doing well. Currently denies any headache, nausea, vomiting. Has chronic neck pain, at baseline. Very seldom gets slightly dizzy when he gets.  He is back to taking walks without any problem.  Review of Systems See above   Past Medical History:  Diagnosis Date   COPD (chronic obstructive pulmonary disease) (HCC)    Depression    Multiple rib fractures 10/2016   R side after fall   Neck pain, chronic    Pneumothorax 10/2016   R side, after fall    Past Surgical History:  Procedure Laterality Date   CHEST TUBE INSERTION Right 10/2016   HERNIA REPAIR  2009   right   KNEE SURGERY Right 1990s   arthroscopy    Current Outpatient Medications  Medication Instructions   aspirin EC 81 mg, Oral, Daily, Swallow whole.   b complex vitamins capsule 1 capsule, Oral, Daily   BLACK CURRANT SEED OIL PO Oral   Calcium Carbonate (CALCIUM 600 PO) Oral   cholecalciferol (VITAMIN D3) 1,000 Units, Oral, Daily   Multiple Vitamin (MULTIVITAMIN WITH MINERALS) TABS tablet 1 tablet, Oral, Daily   Probiotic Product (PROBIOTIC-10 PO) Take by mouth.   venlafaxine XR (EFFEXOR-XR) 37.5 mg, Oral, Daily with breakfast   zinc gluconate 50 mg, Daily       Objective:   Physical Exam BP 120/74   Pulse 74   Temp 98 F (36.7 C) (Oral)   Resp 16   Ht 5\' 9"  (1.753 m)   Wt 164 lb 4 oz (74.5 kg)   SpO2 98%   BMI 24.26 kg/m  General:   Well developed, NAD, BMI noted. HEENT:   Normocephalic . Face symmetric, atraumatic Scalp: Very small hematoma on the left side, around 2 cm at most.  Not tender Chest wall: Left side without hematomas, no TTP Lungs:  CTA B Normal respiratory effort, no intercostal retractions, no accessory muscle use. Heart: RRR,  no murmur. MSK: Shoulders with normal range of motion Lower extremities: no pretibial edema bilaterally  Skin: Not pale. Not jaundice Neurologic:  alert & oriented X3.  Speech normal, gait appropriate for age and unassisted Psych--  Cognition and judgment appear intact.  Cooperative with normal attention span and concentration.  Behavior appropriate. No anxious or depressed appearing.      Assessment   Assessment  Depression - on effexor Dyslipidemia COPD, smoker. 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  No evidence of any stenosis R PTX 10-2016, traumatic  PLAN Mechanical fall, head concussion: As described above, workup at the ER negative, patient currently with no red flag symptoms, feeling well. No further testing is needed at this point. I did give him some literature about her concussion. Encouraged safety.  == Elevated PSA: 06-2022, PSA increased to 10.2, patient subsequently  saw urology, PSA decreased to 1.9 (08/26/2022, [facility]). DRE today is normal, has no symptoms, pt request recheck a PSA.  Will do.  Will also get the urology office visit note. Dyslipidemia: CV RF elevated at 17.2%, he has chosen not to take his statins, benefits discussed again. ASA: Since he is not taking statins, I think aspirin for few years will be helpful.  He agreed.  Will start.   Hyperkalemia: Mild, see last BMP, recheck levels. Vaccine advice: Declines flu shot or COVID-vaccine, pros>cons, recommend to reconsider RTC 4 m

## 2023-05-26 NOTE — Assessment & Plan Note (Signed)
Mechanical fall, head concussion: As described above, workup at the ER negative, patient currently with no red flag symptoms, feeling well. No further testing is needed at this point. I did give him some literature about her concussion. Encouraged safety!Marland Kitchen

## 2023-05-31 NOTE — Addendum Note (Signed)
Addended by: Willow Ora E on: 05/31/2023 05:06 PM   Modules accepted: Level of Service

## 2023-06-06 ENCOUNTER — Encounter: Payer: Self-pay | Admitting: Internal Medicine

## 2023-06-08 ENCOUNTER — Other Ambulatory Visit: Payer: Self-pay | Admitting: Acute Care

## 2023-06-08 DIAGNOSIS — Z122 Encounter for screening for malignant neoplasm of respiratory organs: Secondary | ICD-10-CM

## 2023-06-08 DIAGNOSIS — Z87891 Personal history of nicotine dependence: Secondary | ICD-10-CM

## 2023-06-28 ENCOUNTER — Other Ambulatory Visit (HOSPITAL_COMMUNITY)
Admission: RE | Admit: 2023-06-28 | Discharge: 2023-06-28 | Disposition: A | Discharge status: Home / Self Care | Payer: Self-pay | Source: Ambulatory Visit | Attending: Oncology | Admitting: Oncology

## 2023-06-28 DIAGNOSIS — Z006 Encounter for examination for normal comparison and control in clinical research program: Secondary | ICD-10-CM | POA: Insufficient documentation

## 2023-07-11 LAB — GENECONNECT MOLECULAR SCREEN: Genetic Analysis Overall Interpretation: NEGATIVE

## 2023-07-29 ENCOUNTER — Encounter: Payer: Medicare Other | Admitting: Internal Medicine

## 2023-08-16 ENCOUNTER — Ambulatory Visit (INDEPENDENT_AMBULATORY_CARE_PROVIDER_SITE_OTHER): Payer: Medicare Other | Admitting: Internal Medicine

## 2023-08-16 ENCOUNTER — Encounter: Payer: Self-pay | Admitting: Internal Medicine

## 2023-08-16 VITALS — BP 126/68 | HR 57 | Temp 98.3°F | Resp 18 | Ht 69.0 in | Wt 166.1 lb

## 2023-08-16 DIAGNOSIS — E785 Hyperlipidemia, unspecified: Secondary | ICD-10-CM | POA: Diagnosis not present

## 2023-08-16 DIAGNOSIS — Z0000 Encounter for general adult medical examination without abnormal findings: Secondary | ICD-10-CM

## 2023-08-16 LAB — CBC WITH DIFFERENTIAL/PLATELET
Basophils Absolute: 0 10*3/uL (ref 0.0–0.1)
Basophils Relative: 0.5 % (ref 0.0–3.0)
Eosinophils Absolute: 0.1 10*3/uL (ref 0.0–0.7)
Eosinophils Relative: 1.2 % (ref 0.0–5.0)
HCT: 44.4 % (ref 39.0–52.0)
Hemoglobin: 15 g/dL (ref 13.0–17.0)
Lymphocytes Relative: 24.5 % (ref 12.0–46.0)
Lymphs Abs: 1.5 10*3/uL (ref 0.7–4.0)
MCHC: 33.8 g/dL (ref 30.0–36.0)
MCV: 88.2 fL (ref 78.0–100.0)
Monocytes Absolute: 0.5 10*3/uL (ref 0.1–1.0)
Monocytes Relative: 8.8 % (ref 3.0–12.0)
Neutro Abs: 4.1 10*3/uL (ref 1.4–7.7)
Neutrophils Relative %: 65 % (ref 43.0–77.0)
Platelets: 226 10*3/uL (ref 150.0–400.0)
RBC: 5.04 Mil/uL (ref 4.22–5.81)
RDW: 13.7 % (ref 11.5–15.5)
WBC: 6.2 10*3/uL (ref 4.0–10.5)

## 2023-08-16 LAB — LIPID PANEL
Cholesterol: 165 mg/dL (ref 0–200)
HDL: 57 mg/dL (ref >39.00)
LDL Cholesterol: 94 mg/dL (ref 0–99)
NonHDL: 108.45
Total CHOL/HDL Ratio: 3
Triglycerides: 72 mg/dL (ref 0.0–149.0)
VLDL: 14.4 mg/dL (ref 0.0–40.0)

## 2023-08-16 LAB — TSH: TSH: 1.88 u[IU]/mL (ref 0.35–5.50)

## 2023-08-16 NOTE — Progress Notes (Unsigned)
 Subjective:    Patient ID: Curtis [name], male    DOB: 11-Mar-1956, 68 y.o.   MRN: [id number]  DOS:  08/16/2023 Type of visit - description: CPX  Here for CPX. Had a URI about 7 to 10 days ago, some cough, clear to yellowish sputum. Had a low-grade temp initially, no fever for the last 2 days. No wheezing. Denies other symptoms.  Wt Readings from Last 3 Encounters:  08/16/23 166 lb 2 oz (75.4 kg)  05/25/23 164 lb 4 oz (74.5 kg)  05/22/23 165 lb (74.8 kg)     Review of Systems  Other than above, a 14 point review of systems is negative      Past Medical History:  Diagnosis Date   COPD (chronic obstructive pulmonary disease) (HCC)    Depression    Multiple rib fractures 10/2016   R side after fall   Neck pain, chronic    Pneumothorax 10/2016   R side, after fall    Past Surgical History:  Procedure Laterality Date   CHEST TUBE INSERTION Right 10/2016   HERNIA REPAIR  2009   right   KNEE SURGERY Right 1990s   arthroscopy   Social History   Socioeconomic History   Marital status: Married    Spouse name: Lawson Fiscal   Number of children: 4   Years of education: Not on file   Highest education level: Bachelor's degree (e.g., BA, AB, BS)  Occupational History   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    Employer: [employer]  Tobacco Use   Smoking status: Former    Current packs/day: 0.00    Average packs/day: 1.3 packs/day for 40.0 years (52.0 ttl pk-yrs)    Types: Cigarettes, E-cigarettes    Start date: 1976    Quit date: 2016    Years since quitting: 9.1   Smokeless tobacco: Never   Tobacco comments:    quit cigarrets, vapes since ~ 05/2017  Vaping Use   Vaping status: Former  Substance and Sexual Activity   Alcohol use: Yes    Comment: socially    Drug use: No   Sexual activity: Not on file  Other Topics Concern   Not on file  Social History Narrative   Lives w/ wife   4 children   --1 son h/o of substance abuse Kathlene November) , he lives w/ them   --1 son is  a Teacher, early years/pre    --Daughter Rexene Alberts NP for ID, 2 children   --Daughter Jasmine December, 1 child             Social Drivers of Corporate investment banker Strain: Low Risk  (08/15/2023)   Overall Financial Resource Strain (CARDIA)    Difficulty of Paying Living Expenses: Not hard at all  Food Insecurity: No Food Insecurity (08/15/2023)   Hunger Vital Sign    Worried About Running Out of Food in the Last Year: Never true    Ran Out of Food in the Last Year: Never true  Transportation Needs: No Transportation Needs (08/15/2023)   PRAPARE - Administrator, Civil Service (Medical): No    Lack of Transportation (Non-Medical): No  Physical Activity: Sufficiently Active (08/15/2023)   Exercise Vital Sign    Days of Exercise per Week: 7 days    Minutes of Exercise per Session: 50 min  Stress: Stress Concern Present (08/15/2023)   [employer] of Occupational Health - Occupational Stress Questionnaire    Feeling of Stress : To some extent  Social Connections: Unknown (08/15/2023)   Social Connection and Isolation Panel [NHANES]    Frequency of Communication with Friends and Family: Three times a week    Frequency of Social Gatherings with Friends and Family: Three times a week    Attends Religious Services: Patient declined    Active Member of Clubs or Organizations: No    Attends Banker Meetings: Never    Marital Status: Married  Catering manager Violence: Not At Risk (04/27/2023)   Humiliation, Afraid, Rape, and Kick questionnaire    Fear of Current or Ex-Partner: No    Emotionally Abused: No    Physically Abused: No    Sexually Abused: No    Current Outpatient Medications  Medication Instructions   aspirin EC 81 mg, Daily   b complex vitamins capsule 1 capsule, Daily   BLACK CURRANT SEED OIL PO Take by mouth.   Calcium Carbonate (CALCIUM 600 PO) Take by mouth.   cholecalciferol (VITAMIN D3) 1,000 Units, Daily   Multiple Vitamin (MULTIVITAMIN WITH  MINERALS) TABS tablet 1 tablet, Daily   venlafaxine XR (EFFEXOR-XR) 37.5 mg, Oral, Daily with breakfast       Objective:   Physical Exam BP 126/68   Pulse (!) 57   Temp 98.3 F (36.8 C) (Oral)   Resp 18   Ht 5\' 9"  (1.753 m)   Wt 166 lb 2 oz (75.4 kg)   SpO2 97%   BMI 24.53 kg/m  General: Well developed, NAD, BMI noted Neck: No  thyromegaly  HEENT:  Normocephalic . Face symmetric, atraumatic Lungs:  Few rhonchi with cough otherwise clear Normal respiratory effort, no intercostal retractions, no accessory muscle use. Heart: RRR,  no murmur.  Abdomen:  Not distended, soft, non-tender. No rebound or rigidity.   Lower extremities: no pretibial edema bilaterally  Skin: Exposed areas without rash. Not pale. Not jaundice Neurologic:  alert & oriented X3.  Speech normal, gait appropriate for age and unassisted Strength symmetric and appropriate for age.  Psych: Cognition and judgment appear intact.  Cooperative with normal attention span and concentration.  Behavior appropriate. No anxious or depressed appearing.     Assessment     Assessment  Depression - on effexor Dyslipidemia COPD, smoker. 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  No evidence of any stenosis R PTX 10-2016, traumatic  PLAN Here for CPX -Td  05-2017  - PNM 23: 2012; PNM 13: 2016; PNM 20: 2023 -Vaccines I recommend: Flu shot, COVID-vaccine, Shingrix, RSV. -CCS: Cscope in 2004 ;Cscope again 03-2010, cscope 01-2021, next 10 years per report   - prostate ca screening: PSA was elevated x 2 last year, saw urology 05/24/2023, elevation probably related to sexual activity.  PSA was 1.5 (December 72,024).  No further evaluation at this time. -Diet and exercise: Doing well, retired, going to the [employer] regularly -Labs reviewed, will get  a FLP CBC and TSH. -Lung cancer screening:  Last lung cancer screening CT 04-2023. -Screen for AAA: US aorta 07-2020: neg Healthcare POA discussed.  See AVS We also discussed the following issues Depression: On Effexor. Dyslipidemia:, in the context of coronary calcification per CT, former heavy smoker, so far has decided not to take statins, I  reiterate benefits of medicines.  Checking labs. COPD: Essentially no symptoms except for the last few days, having a URI, recommend conservative treatment.  Elevated PSA: See comments under prostate cancer screening, recheck in few months. RTC 6 months

## 2023-08-16 NOTE — Patient Instructions (Signed)
   GO TO THE LAB : Get the blood work     Next visit with me in 6 months    Please schedule it at the front desk     "Health Care Power of attorney" ,  "Living will" (Advance care planning documents)  If you already have a living will or healthcare power of attorney, is recommended you bring the copy to be scanned in your chart.   The document will be available to all the doctors you see in the system.  Advance care planning is a process that supports adults in  understanding and sharing their preferences regarding future medical care.  The patient's preferences are recorded in documents called Advance Directives and the can be modified at any time while the patient is in full mental capacity.   If you don't have one, please consider create one.      More information at: [email or web address]

## 2023-08-17 ENCOUNTER — Encounter: Payer: Self-pay | Admitting: Internal Medicine

## 2023-08-17 NOTE — Assessment & Plan Note (Signed)
 Here for CPX  We also discussed the following issues Depression: On Effexor. Dyslipidemia:, in the context of coronary calcification per CT, former heavy smoker, so far has decided not to take statins, I  reiterate benefits of medicines.  Checking labs. COPD: Essentially no symptoms except for the last few days, having a URI, recommend conservative treatment. Elevated PSA: See comments under prostate cancer screening, recheck in few months. RTC 6 months

## 2023-08-17 NOTE — Assessment & Plan Note (Signed)
 Here for CPX -Td  05-2017  - PNM 23: 2012; PNM 13: 2016; PNM 20: 2023 -Vaccines I recommend: Flu shot, COVID-vaccine, Shingrix, RSV. -CCS: Cscope in 2004 ;Cscope again 03-2010, cscope 01-2021, next 10 years per report   - prostate ca screening: PSA was elevated x 2 last year, saw urology 05/24/2023, elevation probably related to sexual activity.  PSA was 1.5 (December 72,024).  No further evaluation at this time. -Diet and exercise: Doing well, retired, going to the [employer] regularly -Labs reviewed, will get a FLP CBC and TSH. -Lung cancer screening:  Last lung cancer screening CT 04-2023. -Screen for AAA: US aorta 07-2020: neg Healthcare POA discussed.  See AVS

## 2023-08-18 ENCOUNTER — Encounter: Payer: Self-pay | Admitting: Internal Medicine

## 2023-08-18 DIAGNOSIS — Z9189 Other specified personal risk factors, not elsewhere classified: Secondary | ICD-10-CM

## 2023-08-18 DIAGNOSIS — I2510 Atherosclerotic heart disease of native coronary artery without angina pectoris: Secondary | ICD-10-CM

## 2023-08-19 NOTE — Telephone Encounter (Signed)
 Referral placed.

## 2023-08-19 NOTE — Telephone Encounter (Signed)
 Arrange a cardiology referral. Dx increased cardiovascular risk, coronary calcifications, not taking statins.

## 2023-08-19 NOTE — Addendum Note (Signed)
 Addended byConrad [place] D on: 08/19/2023 04:16 PM   Modules accepted: Orders

## 2023-11-07 ENCOUNTER — Ambulatory Visit: Payer: Medicare Other | Attending: Cardiology | Admitting: Cardiology

## 2023-11-07 ENCOUNTER — Encounter: Payer: Self-pay | Admitting: Cardiology

## 2023-11-07 VITALS — BP 120/70 | HR 70 | Ht 69.0 in | Wt 166.0 lb

## 2023-11-07 DIAGNOSIS — R0609 Other forms of dyspnea: Secondary | ICD-10-CM | POA: Diagnosis not present

## 2023-11-07 DIAGNOSIS — I2510 Atherosclerotic heart disease of native coronary artery without angina pectoris: Secondary | ICD-10-CM | POA: Diagnosis not present

## 2023-11-07 DIAGNOSIS — F17200 Nicotine dependence, unspecified, uncomplicated: Secondary | ICD-10-CM | POA: Diagnosis not present

## 2023-11-07 DIAGNOSIS — Z7689 Persons encountering health services in other specified circumstances: Secondary | ICD-10-CM | POA: Diagnosis not present

## 2023-11-07 NOTE — Progress Notes (Signed)
 Cardiology Consultation:    Date:  11/07/2023   ID:  Yordi, Pankratz July 27, 1955, MRN [id number]  PCP:  Ezell Hollow, MD  Cardiologist:  Ralene Burger, MD   Referring MD: Ezell Hollow, MD   Chief Complaint  Patient presents with   Establish Care   lipid check    History of Present Illness:    SALIH WARWICK is a 68 y.o. male who is being seen today for the evaluation of high cholesterol risk assessment of coronary artery disease at the request of Ezell Hollow, MD. past medical history significant for COPD, active smoking to still ongoing, depression, dyslipidemia he was referred to us  for assessment of his risk factor for coronary artery disease.  He is doing very well.  He exercised on the regular basis, just come back from the trip to [place] and [place] he walk a lot and he had no difficulty doing it.  Denies have any chest pain tightness squeezing pressure burning chest he does get short of breath while walking though.  Previously he had CT of his chest done to look for cancer he did was noted to have some calcification of the coronary artery however no quantitation has been done.  He does have family history of coronary disease but not premature.  He is on keto diet as his wife is as well.  Still continues to smoke 1 pack/day.  Past Medical History:  Diagnosis Date   COPD (chronic obstructive pulmonary disease) (HCC)    Depression    Multiple rib fractures 10/2016   R side after fall   Neck pain, chronic    Pneumothorax 10/2016   R side, after fall    Past Surgical History:  Procedure Laterality Date   CHEST TUBE INSERTION Right 10/2016   HERNIA REPAIR  2009   right   KNEE SURGERY Right 1990s   arthroscopy    Current Medications: Current Meds  Medication Sig   Acetylcysteine (NAC PO) Take 1 tablet by mouth daily.   Ascorbic Acid 500 MG CAPS Take 1 capsule by mouth daily.   ASHWAGANDHA PO Take 1 tablet by mouth daily.   aspirin EC 81 MG tablet Take 81 mg by  mouth daily. Swallow whole.   b complex vitamins capsule Take 1 capsule by mouth daily.   BLACK CURRANT SEED OIL PO Take 1 tablet by mouth daily.   Calcium Carbonate (CALCIUM 600 PO) Take by mouth.   cholecalciferol (VITAMIN D3) 25 MCG (1000 UNIT) tablet Take 1,000 Units by mouth daily.   Multiple Vitamin (MULTIVITAMIN WITH MINERALS) TABS tablet Take 1 tablet by mouth daily.   OVER THE COUNTER MEDICATION Take 1 tablet by mouth daily. Pumpkin seed oil   venlafaxine  XR (EFFEXOR -XR) 37.5 MG 24 hr capsule Take 1 capsule (37.5 mg total) by mouth daily with breakfast.     Allergies:   Patient has no known allergies.   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    Employer: [employer]  Tobacco Use   Smoking status: Former    Current packs/day: 0.00    Average packs/day: 1.3 packs/day for 40.0 years (52.0 ttl pk-yrs)    Types: Cigarettes, E-cigarettes    Start date: 1976  Quit date: 2016    Years since quitting: 9.3   Smokeless tobacco: Never   Tobacco comments:    quit cigarrets, vapes since ~ 05/2017  Vaping Use   Vaping status: Former  Substance and Sexual Activity   Alcohol use: Yes    Comment: socially    Drug use: No   Sexual activity: Not on file  Other Topics Concern   Not on file  Social History Narrative   Lives w/ wife   4 children   --1 son h/o of substance abuse Athena Bland) , he lives w/ them   --1 son is a Teacher, early years/pre    --Daughter Gibson Kurtz NP for ID, 2 children   --Daughter Genevia Kern, 1 child             Social Drivers of Corporate investment banker Strain: Low Risk  (08/15/2023)   Overall Financial Resource Strain (CARDIA)    Difficulty of Paying Living Expenses: Not hard at all  Food Insecurity: No Food Insecurity (08/15/2023)    Hunger Vital Sign    Worried About Running Out of Food in the Last Year: Never true    Ran Out of Food in the Last Year: Never true  Transportation Needs: No Transportation Needs (08/15/2023)   PRAPARE - Administrator, Civil Service (Medical): No    Lack of Transportation (Non-Medical): No  Physical Activity: Sufficiently Active (08/15/2023)   Exercise Vital Sign    Days of Exercise per Week: 7 days    Minutes of Exercise per Session: 50 min  Stress: Stress Concern Present (08/15/2023)   [employer] of Occupational Health - Occupational Stress Questionnaire    Feeling of Stress : To some extent  Social Connections: Unknown (08/15/2023)   Social Connection and Isolation Panel [NHANES]    Frequency of Communication with Friends and Family: Three times a week    Frequency of Social Gatherings with Friends and Family: Three times a week    Attends Religious Services: Patient declined    Active Member of Clubs or Organizations: No    Attends Banker Meetings: Never    Marital Status: Married     Family History: The patient's family history includes Alcohol abuse in his son; Breast cancer (age of onset: 62) in his mother; COPD in his father; Cancer in his mother; Diabetes in his father; Stroke (age of onset: 31) in his father. There is no history of Colon cancer, Prostate cancer, or CAD. ROS:   Please see the history of present illness.    All 14 point review of systems negative except as described per history of present illness.  EKGs/Labs/Other Studies Reviewed:    The following studies were reviewed today:   EKG:  EKG Interpretation Date/Time:  Monday Nov 07 2023 13:49:22 EDT Ventricular Rate:  70 PR Interval:  176 QRS Duration:  88 QT Interval:  386 QTC Calculation: 416 R Axis:   -15  Text Interpretation: Sinus rhythm with occasional Premature ventricular complexes Low voltage QRS When compared with ECG of 22-May-2023 18:10, PREVIOUS ECG IS PRESENT  Confirmed by Ralene Burger [phone]) on 11/07/2023 1:53:01 PM    Recent Labs: 05/22/2023: ALT 23; BUN 19; Creatinine, Ser 0.96; Potassium 3.6; Sodium 142 08/16/2023: Hemoglobin 15.0; Platelets 226.0; TSH 1.88  Recent Lipid Panel    Component Value Date/Time   CHOL 165 08/16/2023 1206   TRIG 72.0 08/16/2023 1206   HDL 57.00 08/16/2023 1206   CHOLHDL 3 08/16/2023 1206   VLDL  14.4 08/16/2023 1206   LDLCALC 94 08/16/2023 1206    Physical Exam:    VS:  BP 120/70 (BP Location: Left Arm, Patient Position: Sitting)   Pulse 70   Ht 5\' 9"  (1.753 m)   Wt 166 lb (75.3 kg)   SpO2 98%   BMI 24.51 kg/m     Wt Readings from Last 3 Encounters:  11/07/23 166 lb (75.3 kg)  08/16/23 166 lb 2 oz (75.4 kg)  05/25/23 164 lb 4 oz (74.5 kg)     GEN:  Well nourished, well developed in no acute distress HEENT: Normal NECK: No JVD; No carotid bruits LYMPHATICS: No lymphadenopathy CARDIAC: RRR, no murmurs, no rubs, no gallops RESPIRATORY:  Clear to auscultation without rales, wheezing or rhonchi  ABDOMEN: Soft, non-tender, non-distended MUSCULOSKELETAL:  No edema; No deformity  SKIN: Warm and dry NEUROLOGIC:  Alert and oriented x 3 PSYCHIATRIC:  Normal affect   ASSESSMENT:    1. Encounter to establish care   2. Dyspnea on exertion   3. Coronary artery calcification   4. Smoking addiction    PLAN:    In order of problems listed above:  Coronary artery calcification.  No symptoms indicating obstructive disease however I think to help us  to manage his cholesterol especially since he does not want to take any cholesterol medication will be beneficial to get a calcium score.  Based on that we will be able to calculate 10 years risk and then I advised him in terms of medication that he need to take for this problem.  In the meantime he is taking aspirin which I think is a good choice.  We did talk about healthy lifestyle need to exercise 5 times a week for 30 minutes moderate intensity exercise  as well as we discussed good diet. Dyspnea on exertion multifactorial I suspect mostly related to his smoking habit. Smoking addiction we spent at least 5 place talking about this I strongly recommend to quit. Also overall assessment of his risk vascular screening will be done   Medication Adjustments/Labs and Tests Ordered: Current medicines are reviewed at length with the patient today.  Concerns regarding medicines are outlined above.  Orders Placed This Encounter  Procedures   EKG 12-Lead   No orders of the defined types were placed in this encounter.   Signed, Manfred Seed, MD, [facility]. 11/07/2023 2:23 PM    [facility]

## 2023-11-07 NOTE — Addendum Note (Signed)
 Addended by: Aurelio Leer I on: 11/07/2023 02:30 PM   Modules accepted: Orders

## 2023-11-07 NOTE — Patient Instructions (Signed)
 Medication Instructions:  Your physician recommends that you continue on your current medications as directed. Please refer to the Current Medication list given to you today.  *If you need a refill on your cardiac medications before your next appointment, please call your pharmacy*  Lab Work: None If you have labs (blood work) drawn today and your tests are completely normal, you will receive your results only by: MyChart Message (if you have MyChart) OR A paper copy in the mail If you have any lab test that is abnormal or we need to change your treatment, we will call you to review the results.  Testing/Procedures: We will order CT coronary calcium score. It will cost $99.00 and is due at time of scan.  Please call to schedule.    [facility] [address] [place], [place] [address] [phone]  [facility] [address] [place], [place] [address] [phone]   [employer]  Follow-Up: At [facility], you and your health needs are our priority.  As part of our continuing mission to provide you with exceptional heart care, our providers are all part of one team.  This team includes your primary Cardiologist (physician) and Advanced Practice Providers or APPs (Physician Assistants and Nurse Practitioners) who all work together to provide you with the care you need, when you need it.  Your next appointment:   2 month(s)  Provider:   Ralene Burger, MD    We recommend signing up for the patient portal called "MyChart".  Sign up information is provided on this After Visit Summary.  MyChart is used to connect with patients for Virtual Visits (Telemedicine).  Patients are able to view lab/test results, encounter notes, upcoming appointments, etc.  Non-urgent messages can be sent to your provider as well.   To learn more about what you can do with MyChart, go to [email or web address].   Other  Instructions None

## 2023-11-09 ENCOUNTER — Ambulatory Visit (HOSPITAL_BASED_OUTPATIENT_CLINIC_OR_DEPARTMENT_OTHER)
Admission: RE | Admit: 2023-11-09 | Discharge: 2023-11-09 | Disposition: A | Discharge status: Home / Self Care | Payer: Self-pay | Source: Ambulatory Visit | Attending: Cardiology | Admitting: Cardiology

## 2023-11-09 DIAGNOSIS — F17200 Nicotine dependence, unspecified, uncomplicated: Secondary | ICD-10-CM | POA: Insufficient documentation

## 2023-11-09 DIAGNOSIS — I2510 Atherosclerotic heart disease of native coronary artery without angina pectoris: Secondary | ICD-10-CM | POA: Insufficient documentation

## 2023-11-09 DIAGNOSIS — Z7689 Persons encountering health services in other specified circumstances: Secondary | ICD-10-CM | POA: Insufficient documentation

## 2023-11-09 DIAGNOSIS — R0609 Other forms of dyspnea: Secondary | ICD-10-CM | POA: Insufficient documentation

## 2023-11-15 ENCOUNTER — Ambulatory Visit: Payer: Self-pay | Admitting: Cardiology

## 2023-11-20 ENCOUNTER — Other Ambulatory Visit: Payer: Self-pay | Admitting: Internal Medicine

## 2023-11-25 ENCOUNTER — Ambulatory Visit (HOSPITAL_COMMUNITY)

## 2023-12-01 ENCOUNTER — Ambulatory Visit (HOSPITAL_COMMUNITY)
Admission: RE | Admit: 2023-12-01 | Discharge: 2023-12-01 | Disposition: A | Discharge status: Home / Self Care | Source: Ambulatory Visit | Attending: Cardiology | Admitting: Cardiology

## 2023-12-01 DIAGNOSIS — R0609 Other forms of dyspnea: Secondary | ICD-10-CM

## 2023-12-01 DIAGNOSIS — I2510 Atherosclerotic heart disease of native coronary artery without angina pectoris: Secondary | ICD-10-CM

## 2023-12-01 DIAGNOSIS — F17200 Nicotine dependence, unspecified, uncomplicated: Secondary | ICD-10-CM

## 2023-12-01 DIAGNOSIS — Z7689 Persons encountering health services in other specified circumstances: Secondary | ICD-10-CM

## 2023-12-14 ENCOUNTER — Telehealth: Payer: Self-pay

## 2023-12-14 NOTE — Telephone Encounter (Signed)
 LVM to call regarding Ca Score results

## 2023-12-14 NOTE — Telephone Encounter (Signed)
Ca Score Results reviewed with pt as per Dr. Vanetta Shawl note.  Pt verbalized understanding and had no additional questions. Routed to PCP

## 2024-01-10 ENCOUNTER — Encounter (HOSPITAL_COMMUNITY): Payer: Self-pay | Admitting: Cardiology

## 2024-01-12 ENCOUNTER — Ambulatory Visit: Attending: Cardiology | Admitting: Cardiology

## 2024-01-12 ENCOUNTER — Encounter: Payer: Self-pay | Admitting: Cardiology

## 2024-01-12 VITALS — BP 122/72 | HR 62 | Ht 69.5 in | Wt 163.0 lb

## 2024-01-12 DIAGNOSIS — R0609 Other forms of dyspnea: Secondary | ICD-10-CM | POA: Diagnosis not present

## 2024-01-12 DIAGNOSIS — E785 Hyperlipidemia, unspecified: Secondary | ICD-10-CM | POA: Diagnosis not present

## 2024-01-12 DIAGNOSIS — J301 Allergic rhinitis due to pollen: Secondary | ICD-10-CM | POA: Insufficient documentation

## 2024-01-12 DIAGNOSIS — I2510 Atherosclerotic heart disease of native coronary artery without angina pectoris: Secondary | ICD-10-CM | POA: Diagnosis not present

## 2024-01-12 DIAGNOSIS — J410 Simple chronic bronchitis: Secondary | ICD-10-CM

## 2024-01-12 NOTE — Patient Instructions (Addendum)
 Medication Instructions:  Your physician recommends that you continue on your current medications as directed. Please refer to the Current Medication list given to you today.  *If you need a refill on your cardiac medications before your next appointment, please call your pharmacy*   Lab Work: 3rd Floor   [address]  Your physician recommends that you return for lab work in:  6 months before appt You need to have labs done when you are fasting.  You can come Monday through Friday 8:00 am to 11:30AM and 1:00 to 4:00. You do not need to make an appointment as the order has already been placed.    Testing/Procedures: None Ordered   Follow-Up: At [facility], you and your health needs are our priority.  As part of our continuing mission to provide you with exceptional heart care, we have created designated Provider Care Teams.  These Care Teams include your primary Cardiologist (physician) and Advanced Practice Providers (APPs -  Physician Assistants and Nurse Practitioners) who all work together to provide you with the care you need, when you need it.  We recommend signing up for the patient portal called MyChart.  Sign up information is provided on this After Visit Summary.  MyChart is used to connect with patients for Virtual Visits (Telemedicine).  Patients are able to view lab/test results, encounter notes, upcoming appointments, etc.  Non-urgent messages can be sent to your provider as well.   To learn more about what you can do with MyChart, go to [email or web address].    Your next appointment:   6 month(s)  The format for your next appointment:   In Person  Provider:   Lamar Fitch, MD    Other Instructions NA

## 2024-01-12 NOTE — Progress Notes (Signed)
 Cardiology Office Note:    Date:  01/12/2024   ID:  Curtis [name], Curtis [name] 1955/12/02, MRN [id number]  PCP:  Amon Aloysius BRAVO, MD  Cardiologist:  Lamar Fitch, MD    Referring MD: Amon Aloysius BRAVO, MD   Chief Complaint  Patient presents with   Follow-up    History of Present Illness:    Curtis [name] is a 68 y.o. male past medical history significant for coronary artery calcification he did have calcium score done which was 106 47 percentile.  Calculated 10 years risk is 13%, coronary age 67y.  He continues to vape, he does have dyslipidemia.  Change his lifestyle being a little more active goes to gym and exercise on a regular basis denies have any chest pain tightness squeezing pressure burning chest.  Past Medical History:  Diagnosis Date   COPD (chronic obstructive pulmonary disease) (HCC)    Depression    Multiple rib fractures 10/2016   R side after fall   Neck pain, chronic    Pneumothorax 10/2016   R side, after fall    Past Surgical History:  Procedure Laterality Date   CHEST TUBE INSERTION Right 10/2016   HERNIA REPAIR  2009   right   KNEE SURGERY Right 1990s   arthroscopy    Current Medications: Current Meds  Medication Sig   Acetylcysteine (NAC PO) Take 1 tablet by mouth daily.   Ascorbic Acid 500 MG CAPS Take 1 capsule by mouth daily.   ASHWAGANDHA PO Take 1 tablet by mouth daily.   aspirin EC 81 MG tablet Take 81 mg by mouth daily. Swallow whole.   b complex vitamins capsule Take 1 capsule by mouth daily.   BLACK CURRANT SEED OIL PO Take 1 tablet by mouth daily.   Calcium Carbonate (CALCIUM 600 PO) Take by mouth.   cholecalciferol (VITAMIN D3) 25 MCG (1000 UNIT) tablet Take 1,000 Units by mouth daily.   Multiple Vitamin (MULTIVITAMIN WITH MINERALS) TABS tablet Take 1 tablet by mouth daily.   OVER THE COUNTER MEDICATION Take 1 tablet by mouth daily. Pumpkin seed oil   venlafaxine  XR (EFFEXOR -XR) 37.5 MG 24 hr capsule Take 1 capsule (37.5 mg total) by mouth  daily with breakfast.     Allergies:   Patient has no known allergies.   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    Employer: [employer]  Tobacco Use   Smoking status: Former    Current packs/day: 0.00    Average packs/day: 1.3 packs/day for 40.0 years (52.0 ttl pk-yrs)    Types: Cigarettes, E-cigarettes    Start date: 1976    Quit date: 2016    Years since quitting: 9.5   Smokeless tobacco: Never   Tobacco comments:    quit cigarrets, vapes since ~ 05/2017  Vaping Use   Vaping status: Former  Substance and Sexual Activity   Alcohol use: Yes    Comment: socially    Drug use: No   Sexual activity: Not on file  Other Topics Concern   Not on file  Social History Narrative   Lives w/ wife   4 children   --1 son h/o of substance abuse [name]) , he lives w/ them   --1 son  is a pharmacist    --Daughter [name] NP for ID, 2 children   --Daughter [name], 1 child             Social Drivers of Health   Financial Resource Strain: Low Risk  (08/15/2023)   Overall Financial Resource Strain (CARDIA)    Difficulty of Paying Living Expenses: Not hard at all  Food Insecurity: No Food Insecurity (08/15/2023)   Hunger Vital Sign    Worried About Running Out of Food in the Last Year: Never true    Ran Out of Food in the Last Year: Never true  Transportation Needs: No Transportation Needs (08/15/2023)   PRAPARE - Administrator, Civil Service (Medical): No    Lack of Transportation (Non-Medical): No  Physical Activity: Sufficiently Active (08/15/2023)   Exercise Vital Sign    Days of Exercise per Week: 7 days    Minutes of Exercise per Session: 50 min  Stress: Stress Concern Present (08/15/2023)   [employer] of Occupational Health  - Occupational Stress Questionnaire    Feeling of Stress : To some extent  Social Connections: Unknown (08/15/2023)   Social Connection and Isolation Panel    Frequency of Communication with Friends and Family: Three times a week    Frequency of Social Gatherings with Friends and Family: Three times a week    Attends Religious Services: Patient declined    Active Member of Clubs or Organizations: No    Attends Banker Meetings: Never    Marital Status: Married     Family History: The patient's family history includes Alcohol abuse in his son; Breast cancer (age of onset: 17) in his mother; COPD in his father; Cancer in his mother; Diabetes in his father; Stroke (age of onset: 13) in his father. There is no history of Colon cancer, Prostate cancer, or CAD. ROS:   Please see the history of present illness.    All 14 point review of systems negative except as described per history of present illness  EKGs/Labs/Other Studies Reviewed:         Recent Labs: 05/22/2023: ALT 23; BUN 19; Creatinine, Ser 0.96; Potassium 3.6; Sodium 142 08/16/2023: Hemoglobin 15.0; Platelets 226.0; TSH 1.88  Recent Lipid Panel    Component Value Date/Time   CHOL 165 08/16/2023 1206   TRIG 72.0 08/16/2023 1206   HDL 57.00 08/16/2023 1206   CHOLHDL 3 08/16/2023 1206   VLDL 14.4 08/16/2023 1206   LDLCALC 94 08/16/2023 1206    Physical Exam:    VS:  BP 122/72 (BP Location: Right Arm, Patient Position: Sitting)   Pulse 62   Ht 5' 9.5 (1.765 m)   Wt 163 lb (73.9 kg)   SpO2 96%   BMI 23.73 kg/m     Wt Readings from Last 3 Encounters:  01/12/24 163 lb (73.9 kg)  11/07/23 166 lb (75.3 kg)  08/16/23 166 lb 2 oz (75.4 kg)     GEN:  Well nourished, well developed in no acute distress HEENT: Normal NECK: No JVD; No carotid bruits LYMPHATICS: No lymphadenopathy CARDIAC: RRR, no murmurs, no rubs, no gallops RESPIRATORY:  Clear to auscultation without rales, wheezing or rhonchi  ABDOMEN:  Soft, non-tender, non-distended MUSCULOSKELETAL:  No edema; No deformity  SKIN: Warm and dry LOWER EXTREMITIES: no swelling NEUROLOGIC:  Alert and oriented x 3 PSYCHIATRIC:  Normal affect   ASSESSMENT:    1. Coronary artery calcification   2. Simple chronic bronchitis (HCC)   3. Dyslipidemia  4. Dyspnea on exertion    PLAN:    In order of problems listed above:  Elevated calcium score we had a long discussion about potentially starting cholesterol medication which I recommended he said he is going to continue life modifications and then we will recheck his cholesterol in about 6 months to see if it improves if not he will press harder on getting on cholesterol medication.  He is already on aspirin he is 10 years risk is more than 10% exactly 13 therefore we will continue. COPD.  Related to smoking.  He does not walk anymore but vapes.  I told him that is probably not the best idea recommended to quit vaping. Dyslipidemia I did review K PN LDL 94 HDL 57 I offered statin he declined discussion as above   Medication Adjustments/Labs and Tests Ordered: Current medicines are reviewed at length with the patient today.  Concerns regarding medicines are outlined above.  No orders of the defined types were placed in this encounter.  Medication changes: No orders of the defined types were placed in this encounter.   Signed, Lamar DOROTHA Fitch, MD, [facility] 01/12/2024 10:44 AM    [facility]

## 2024-01-23 ENCOUNTER — Ambulatory Visit: Admitting: Cardiology

## 2024-02-14 ENCOUNTER — Encounter: Payer: Self-pay | Admitting: Internal Medicine

## 2024-02-14 ENCOUNTER — Ambulatory Visit (INDEPENDENT_AMBULATORY_CARE_PROVIDER_SITE_OTHER): Payer: Medicare Other | Admitting: Internal Medicine

## 2024-02-14 VITALS — BP 132/80 | HR 61 | Temp 97.5°F | Resp 16 | Ht 69.5 in | Wt 165.5 lb

## 2024-02-14 DIAGNOSIS — R972 Elevated prostate specific antigen [PSA]: Secondary | ICD-10-CM

## 2024-02-14 DIAGNOSIS — E785 Hyperlipidemia, unspecified: Secondary | ICD-10-CM

## 2024-02-14 DIAGNOSIS — F339 Major depressive disorder, recurrent, unspecified: Secondary | ICD-10-CM | POA: Diagnosis not present

## 2024-02-14 DIAGNOSIS — Z7185 Encounter for immunization safety counseling: Secondary | ICD-10-CM | POA: Diagnosis not present

## 2024-02-14 NOTE — Progress Notes (Unsigned)
   Subjective:    Patient ID: Curtis [name], male    DOB: 1956/05/20, 68 y.o.   MRN: [id number]  DOS:  02/14/2024 Type of visit - description: Follow-up  Routine follow-up, feeling well. Still has a lot of stress. PSA was elevated, chart reviewed.  Review of Systems See above   Past Medical History:  Diagnosis Date   COPD (chronic obstructive pulmonary disease) (HCC)    Depression    Multiple rib fractures 10/2016   R side after fall   Neck pain, chronic    Pneumothorax 10/2016   R side, after fall    Past Surgical History:  Procedure Laterality Date   CHEST TUBE INSERTION Right 10/2016   HERNIA REPAIR  2009   right   KNEE SURGERY Right 1990s   arthroscopy    Current Outpatient Medications  Medication Instructions   Acetylcysteine (NAC PO) 1 tablet, Daily   Ascorbic Acid 500 MG CAPS 1 capsule, Daily   ASHWAGANDHA PO 1 tablet, Daily   aspirin EC 81 mg, Daily   b complex vitamins capsule 1 capsule, Daily   BLACK CURRANT SEED OIL PO 1 tablet, Daily   Calcium Carbonate (CALCIUM 600 PO) Take by mouth.   cholecalciferol (VITAMIN D3) 1,000 Units, Daily   Multiple Vitamin (MULTIVITAMIN WITH MINERALS) TABS tablet 1 tablet, Daily   OVER THE COUNTER MEDICATION 1 tablet, Daily   venlafaxine  XR (EFFEXOR -XR) 37.5 mg, Oral, Daily with breakfast       Objective:   Physical Exam BP 132/80   Pulse 61   Temp (!) 97.5 F (36.4 C) (Oral)   Resp 16   Ht 5' 9.5 (1.765 m)   Wt 165 lb 8 oz (75.1 kg)   SpO2 96%   BMI 24.09 kg/m  General:   Well developed, NAD, BMI noted. HEENT:  Normocephalic . Face symmetric, atraumatic Skin: Not pale. Not jaundice Neurologic:  alert & oriented X3.  Speech normal, gait appropriate for age and unassisted Psych--  Cognition and judgment appear intact.  Cooperative with normal attention span and concentration.  Behavior appropriate. No anxious or depressed appearing.      Assessment    Assessment  Depression - on  effexor  Dyslipidemia--declined statins COPD, former smoker. PFTs 11-2012 mild COPD Coronary calcium score: 106, 47 percentile, May 2025 Chronic neck pain --- saw Dr Cesario 2012 BPH (asymmetric prostate, saw urology before) 08/01/2020: [place]  AAA >>>some abnormal dilatation of the R and L common iliac artery.  No evidence of any stenosis R PTX 10-2016, traumatic  PLAN Depression: Stress ongoing related to his son who lives with him, son goes to [place] for alcohol abuse.  Listening therapy provided, continue Effexor .  It seems to help. Dyslipidemia: Since LOV, had a calcium coronary score, came back positive, saw cardiology, still declines statins. Elevated PSA: October 2024 PSA was 9.4, saw urology 05/24/2023, had a PSA December 2024: 1.5 (KPN) back to normal. Vaccine advice provided, see AVS RTC 6 months CPX

## 2024-02-14 NOTE — Patient Instructions (Signed)
 Vaccines are recommended Flu shot this fall COVID booster this fall RSV     Please go to the front desk Get your checked out Arrange for office visit in 6 months for a physical exam.

## 2024-02-15 NOTE — Assessment & Plan Note (Signed)
 Depression: Stress ongoing related to his son who lives with him, son goes to [place] for alcohol abuse.  Listening therapy provided, continue Effexor .  It seems to help. Dyslipidemia: Since LOV, had a calcium coronary score, came back positive, saw cardiology, still declines statins. Elevated PSA: October 2024 PSA was 9.4, saw urology 05/24/2023, had a PSA December 2024: 1.5 (KPN) back to normal. Vaccine advice provided, see AVS RTC 6 months CPX

## 2024-04-11 NOTE — Progress Notes (Unsigned)
 [facility] [address] [place] [address] Phone: [phone] Subjective:   Curtis [name] Curtis [name], am serving as a scribe for Dr. Arthea Curtis.  I'Curtis [name] seeing this patient by the request  of:  Amon Aloysius BRAVO, MD  CC: Right shoulder pain  YEP:Dlagzrupcz  Curtis [name] is a 68 y.o. male coming in with complaint of R shoulder pain. Patient states that he has had pain for past 6 months throughout the joint. Painful to lie on side at night. Denies any radiating symptoms. Hx of neck pain.     Previous imaging includes a CT of the cervical spine in November of last year.  This was independently visualized by me that did show that there is moderate facet and advanced degenerative disc disease from C3-C7.  Past Medical History:  Diagnosis Date   COPD (chronic obstructive pulmonary disease) (HCC)    Depression    Multiple rib fractures 10/2016   R side after fall   Neck pain, chronic    Pneumothorax 10/2016   R side, after fall   Past Surgical History:  Procedure Laterality Date   CHEST TUBE INSERTION Right 10/2016   HERNIA REPAIR  2009   right   KNEE SURGERY Right 1990s   arthroscopy   Social History   Socioeconomic History   Marital status: Married    Spouse name: Curtis [name]   Number of children: 4   Years of education: Not on file   Highest education level: Bachelor's degree (e.g., BA, AB, BS)  Occupational History   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    Employer: [employer]  Tobacco Use   Smoking status: Former    Current packs/day: 0.00    Average packs/day: 1.3 packs/day for 40.0 years (52.0 ttl pk-yrs)    Types: Cigarettes, E-cigarettes    Start date: 1976    Quit date: 2016    Years since quitting: 9.7   Smokeless tobacco: Never   Tobacco comments:    quit cigarrets, vapes since ~ 05/2017  Vaping Use   Vaping status: Former  Substance and Sexual Activity   Alcohol use: Yes    Comment: socially    Drug use: No   Sexual activity: Not  on file  Other Topics Concern   Not on file  Social History Narrative   Lives w/ wife   4 children   --1 son h/o of substance abuse Curtis [name]) , he lives w/ them   --1 son is a Teacher, early years/pre    --Daughter Curtis Fireman NP for ID, 2 children   --Daughter Curtis [name], 1 child             Social Drivers of Corporate investment banker Strain: Low Risk  (08/15/2023)   Overall Financial Resource Strain (CARDIA)    Difficulty of Paying Living Expenses: Not hard at all  Food Insecurity: No Food Insecurity (08/15/2023)   Hunger Vital Sign    Worried About Running Out of Food in the Last Year: Never true    Ran Out of Food in the Last Year: Never true  Transportation Needs: No Transportation Needs (08/15/2023)   PRAPARE - Administrator, Civil Service (Medical): No    Lack of Transportation (Non-Medical): No  Physical Activity: Sufficiently Active (08/15/2023)   Exercise Vital Sign    Days of Exercise per Week: 7 days    Minutes of Exercise per Session: 50 min  Stress: Stress Concern Present (08/15/2023)   Curtis [name]  of Occupational Health - Occupational Stress Questionnaire    Feeling of Stress : To some extent  Social Connections: Unknown (08/15/2023)   Social Connection and Isolation Panel    Frequency of Communication with Friends and Family: Three times a week    Frequency of Social Gatherings with Friends and Family: Three times a week    Attends Religious Services: Patient declined    Active Member of Clubs or Organizations: No    Attends Banker Meetings: Never    Marital Status: Married   No Known Allergies Family History  Problem Relation Age of Onset   Cancer Mother        skin   Breast cancer Mother 68   COPD Father    Diabetes Father    Stroke Father 31   Alcohol abuse Son    Colon cancer Neg Hx    Prostate cancer Neg Hx    CAD Neg Hx        MGF?       Current Outpatient Medications (Analgesics):    aspirin EC 81 MG tablet, Take 81 mg  by mouth daily. Swallow whole.   Current Outpatient Medications (Other):    Acetylcysteine (NAC PO), Take 1 tablet by mouth daily.   Ascorbic Acid 500 MG CAPS, Take 1 capsule by mouth daily.   ASHWAGANDHA PO, Take 1 tablet by mouth daily.   b complex vitamins capsule, Take 1 capsule by mouth daily.   BLACK CURRANT SEED OIL PO, Take 1 tablet by mouth daily.   Calcium Carbonate (CALCIUM 600 PO), Take by mouth.   cholecalciferol (VITAMIN D3) 25 MCG (1000 UNIT) tablet, Take 1,000 Units by mouth daily.   Multiple Vitamin (MULTIVITAMIN WITH MINERALS) TABS tablet, Take 1 tablet by mouth daily.   OVER THE COUNTER MEDICATION, Take 1 tablet by mouth daily. Pumpkin seed oil   venlafaxine  XR (EFFEXOR -XR) 37.5 MG 24 hr capsule, Take 1 capsule (37.5 mg total) by mouth daily with breakfast.   Reviewed prior external information including notes and imaging from  primary care provider As well as notes that were available from care everywhere and other healthcare systems.  Past medical history, social, surgical and family history all reviewed in electronic medical record.  No pertanent information unless stated regarding to the chief complaint.   Review of Systems:  No headache, visual changes, nausea, vomiting, diarrhea, constipation, dizziness, abdominal pain, skin rash, fevers, chills, night sweats, weight loss, swollen lymph nodes, body aches, joint swelling, chest pain, shortness of breath, mood changes. POSITIVE muscle aches  Objective  There were no vitals taken for this visit.   General: No apparent distress alert and oriented x3 mood and affect normal, dressed appropriately.  HEENT: Pupils equal, extraocular movements intact  Respiratory: Patient's speak in full sentences and does not appear short of breath  Cardiovascular: No lower extremity edema, non tender, no erythema   Right shoulder exam shows does have some mild limitation in range of motion.  Positive crossover noted.  Negative  O'Brien's.  Full strength of the rotator cuff noted.  Neck exam shows significant limited range of motion especially with flexion and extension of the neck.  Negative Spurling's  Limited muscular skeletal ultrasound was performed and interpreted by Curtis [name], Curtis [name]  Limited ultrasound shows rotator cuff is intact with some underlying arthritic changes of the glenohumeral joint noted.  In addition to this patient does have some significant narrowing of the acromioclavicular joint.  Mild hypoechoic changes noted of the bicep  tendon Impression: AC arthritis  97110; 15 additional minutes spent for Therapeutic exercises as stated in above notes.  This included exercises focusing on stretching, strengthening, with significant focus on eccentric aspects.   Long term goals include an improvement in range of motion, strength, endurance as well as avoiding reinjury. Patient's frequency would include in 1-2 times a day, 3-5 times a week for a duration of 6-12 weeks. Shoulder Exercises that included:  Basic scapular stabilization to include adduction and depression of scapula Scaption, focusing on proper movement and good control Internal and External rotation utilizing a theraband, with elbow tucked at side entire time Rows with theraband   Proper technique shown and discussed handout in great detail with ATC.  All questions were discussed and answered.     Impression and Recommendations:    The above documentation has been reviewed and is accurate and complete Curtis Bove Curtis [name] Kashaun Bebo, Curtis [name]

## 2024-04-12 ENCOUNTER — Encounter: Payer: Self-pay | Admitting: Family Medicine

## 2024-04-12 ENCOUNTER — Ambulatory Visit: Admitting: Family Medicine

## 2024-04-12 ENCOUNTER — Ambulatory Visit

## 2024-04-12 ENCOUNTER — Other Ambulatory Visit: Payer: Self-pay

## 2024-04-12 VITALS — BP 132/92 | HR 63 | Ht 69.5 in | Wt 167.0 lb

## 2024-04-12 DIAGNOSIS — M19011 Primary osteoarthritis, right shoulder: Secondary | ICD-10-CM

## 2024-04-12 DIAGNOSIS — M25511 Pain in right shoulder: Secondary | ICD-10-CM

## 2024-04-12 NOTE — Assessment & Plan Note (Signed)
 Some arthritic changes noted, discussed icing regimen and home exercises, discussed which activities to do and which ones to avoid.  Increase activity slowly.  Discussed icing regimen.  Follow-up with me again in 6 to 8 weeks

## 2024-04-12 NOTE — Patient Instructions (Signed)
 Xray today Exercises Ice  Keep hands in peripheral vision See me in 2-3 months

## 2024-04-17 ENCOUNTER — Ambulatory Visit: Payer: Self-pay | Admitting: Family Medicine

## 2024-04-27 ENCOUNTER — Ambulatory Visit (INDEPENDENT_AMBULATORY_CARE_PROVIDER_SITE_OTHER): Payer: Medicare Other | Admitting: *Deleted

## 2024-04-27 VITALS — Ht 69.5 in | Wt 168.0 lb

## 2024-04-27 DIAGNOSIS — Z0000 Encounter for general adult medical examination without abnormal findings: Secondary | ICD-10-CM | POA: Diagnosis not present

## 2024-04-27 DIAGNOSIS — Z122 Encounter for screening for malignant neoplasm of respiratory organs: Secondary | ICD-10-CM

## 2024-04-27 NOTE — Patient Instructions (Addendum)
 Curtis [name] , Thank you for taking time out of your busy schedule to complete your Annual Wellness Visit with me. I enjoyed our conversation and look forward to speaking with you again next year. I, as well as your care team,  appreciate your ongoing commitment to your health goals. Please review the following plan we discussed and let me know if I can assist you in the future. Your Game plan/ To Do List   Goal: Continue eating healthy & walking.  Congratulations on meeting this goal. Keep up all your good work!  Referrals: If you haven't heard from the office you've been referred to, please reach out to them at the phone provided.   Lung cancer screen [facility]): [phone]  Follow up Visits: Next Medicare AWV with our clinical staff:  05/01/25 1pm, telephone   Next Office Visit with your provider: 08/21/24 10am, Dr Amon  Clinician Recommendations:  Aim for 30 minutes of exercise or brisk walking, 6-8 glasses of water, and 5 servings of fruits and vegetables each day.   You will need to get the following vaccines at your local pharmacy: Shingles      This is a list of the screening recommended for you and due dates:  Health Maintenance  Topic Date Due   Zoster (Shingles) Vaccine (1 of 2) Never done   Medicare Annual Wellness Visit  04/26/2024   Screening for Lung Cancer  05/15/2024   Flu Shot  09/25/2024*   DTaP/Tdap/Td vaccine (4 - Td or Tdap) 06/15/2027   Colon Cancer Screening  02/03/2031   Pneumococcal Vaccine for age over 32  Completed   Hepatitis C Screening  Completed   Meningitis B Vaccine  Aged Out   COVID-19 Vaccine  Discontinued  *Topic was postponed. The date shown is not the original due date.   Please let me know if you do not receive your Advanced Directive Packet within 1 week.  Advanced directives: (Provided) Advance directive discussed with you today. I have provided a copy for you to complete at home and have notarized. Once this is complete, please  bring a copy in to our office so we can scan it into your chart.  Advance Care Planning is important because it:  [x]  Makes sure you receive the medical care that is consistent with your values, goals, and preferences  [x]  It provides guidance to your family and loved ones and reduces their decisional burden about whether or not they are making the right decisions based on your wishes.  Follow the link provided in your after visit summary or read over the paperwork we have mailed to you to help you started getting your Advance Directives in place. If you need assistance in completing these, please reach out to us  so that we can help you!  See attachments for Preventive Care and Fall Prevention Tips.

## 2024-04-27 NOTE — Progress Notes (Signed)
 Subjective:   Curtis [name] is a 68 y.o. who presents for a Medicare Wellness preventive visit.  As a reminder, Annual Wellness Visits don't include a physical exam, and some assessments may be limited, especially if this visit is performed virtually. We may recommend an in-person follow-up visit with your provider if needed.  Visit Complete: Virtual I connected with  Curtis [name] on 04/27/24 by a audio enabled telemedicine application and verified that I am speaking with the correct person using two identifiers.  Patient Location: Home  Provider Location: Office/Clinic  I discussed the limitations of evaluation and management by telemedicine. The patient expressed understanding and agreed to proceed.  Vital Signs: Because this visit was a virtual/telehealth visit, some criteria may be missing or patient reported. Any vitals not documented were not able to be obtained and vitals that have been documented are patient reported.  VideoDeclined- This patient declined Librarian, academic. Therefore the visit was completed with audio only.  Persons Participating in Visit: Patient.  AWV Questionnaire: Yes: Patient Medicare AWV questionnaire was completed by the patient on 04/20/24; I have confirmed that all information answered by patient is correct and no changes since this date.  Cardiac Risk Factors include: advanced age (>5men, >47 women);male gender;dyslipidemia;Other (see comment);smoking/ tobacco exposure, Risk factor comments: COPD, coronary artery calcifications     Objective:    Today's Vitals   04/27/24 1101  Weight: 168 lb (76.2 kg)  Height: 5' 9.5 (1.765 m)   Body mass index is 24.45 kg/m.     04/27/2024   11:24 AM 04/27/2023   11:08 AM 03/08/2022    1:08 PM 10/29/2016   10:20 PM  Advanced Directives  Does Patient Have a Medical Advance Directive? No No No No   Would patient like information on creating a medical advance directive?  Yes (MAU/Ambulatory/Procedural Areas - Information given) No - Patient declined  No - Patient declined      Data saved with a previous flowsheet row definition    Current Medications (verified) Outpatient Encounter Medications as of 04/27/2024  Medication Sig   Acetylcysteine (NAC PO) Take 1 tablet by mouth daily.   Ascorbic Acid 500 MG CAPS Take 1 capsule by mouth daily.   ASHWAGANDHA PO Take 1 tablet by mouth daily.   aspirin EC 81 MG tablet Take 81 mg by mouth daily. Swallow whole.   b complex vitamins capsule Take 1 capsule by mouth daily.   BLACK CURRANT SEED OIL PO Take 1 tablet by mouth daily.   Calcium Carbonate (CALCIUM 600 PO) Take by mouth.   cholecalciferol (VITAMIN D3) 25 MCG (1000 UNIT) tablet Take 1,000 Units by mouth daily.   GARLIC PO Take 1 capsule by mouth daily.   Multiple Vitamin (MULTIVITAMIN WITH MINERALS) TABS tablet Take 1 tablet by mouth daily.   OREGANO PO Take 1 capsule by mouth as needed (for colds).   OVER THE COUNTER MEDICATION Take 1 tablet by mouth daily. Pumpkin seed oil   venlafaxine  XR (EFFEXOR -XR) 37.5 MG 24 hr capsule Take 1 capsule (37.5 mg total) by mouth daily with breakfast.   No facility-administered encounter medications on file as of 04/27/2024.    Allergies (verified) Patient has no known allergies.   History: Past Medical History:  Diagnosis Date   COPD (chronic obstructive pulmonary disease) (HCC)    Depression    Multiple rib fractures 10/2016   R side after fall   Neck pain, chronic    Pneumothorax 10/2016  R side, after fall   Past Surgical History:  Procedure Laterality Date   CHEST TUBE INSERTION Right 10/2016   HERNIA REPAIR  2009   right   KNEE SURGERY Right 1990s   arthroscopy   Family History  Problem Relation Age of Onset   Cancer Mother        skin   Breast cancer Mother 10   COPD Father    Diabetes Father    Stroke Father 13   Alcohol abuse Son    Colon cancer Neg Hx    Prostate cancer Neg Hx    CAD  Neg Hx        MGF?   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    Employer: [employer]  Tobacco Use   Smoking status: Former    Current packs/day: 0.00    Average packs/day: 1.3 packs/day for 40.0 years (52.0 ttl pk-yrs)    Types: Cigarettes, E-cigarettes    Start date: 1976    Quit date: 2016    Years since quitting: 9.8   Smokeless tobacco: Never   Tobacco comments:    quit cigarrets, vapes since ~ 05/2017  Vaping Use   Vaping status: Every Day  Substance and Sexual Activity   Alcohol use: Yes    Comment: socially, monthly or less   Drug use: No   Sexual activity: Not on file  Other Topics Concern   Not on file  Social History Narrative   Lives w/ wife   4 children   --1 son h/o of substance abuse [name]) , he lives w/ them   --1 son is a teacher, early years/pre    --Daughter [name] NP for ID, 2 children   --Daughter [name], 1 child             Social Drivers of Corporate Investment Banker Strain: Low Risk  (04/27/2024)   Overall Financial Resource Strain (CARDIA)    Difficulty of Paying Living Expenses: Not very hard  Food Insecurity: No Food Insecurity (04/27/2024)   Hunger Vital Sign    Worried About Running Out of Food in the Last Year: Never true    Ran Out of Food in the Last Year: Never true  Transportation Needs: No Transportation Needs (04/27/2024)   PRAPARE - Administrator, Civil Service (Medical): No    Lack of Transportation (Non-Medical): No  Physical Activity: Sufficiently Active (04/27/2024)   Exercise Vital Sign    Days of Exercise per Week: 7 days    Minutes of Exercise per Session: 60 min  Stress: No Stress Concern Present (04/27/2024)   [employer] of Occupational Health - Occupational Stress  Questionnaire    Feeling of Stress: Not at all  Social Connections: Moderately Integrated (04/27/2024)   Social Connection and Isolation Panel    Frequency of Communication with Friends and Family: Three times a week    Frequency of Social Gatherings with Friends and Family: Three times a week    Attends Religious Services: Never    Active Member of Clubs or Organizations: Yes    Attends Banker Meetings: More than 4 times per year    Marital Status: Married    Tobacco Counseling Counseling given:  Not Answered Tobacco comments: quit cigarrets, vapes since ~ 05/2017    Clinical Intake:  Pre-visit preparation completed: Yes  Pain : No/denies pain     BMI - recorded: 24.45 Nutritional Status: BMI of 19-24  Normal Nutritional Risks: None Diabetes: No  No results found for: HGBA1C   How often do you need to have someone help you when you read instructions, pamphlets, or other written materials from your doctor or pharmacy?: 1 - Never What is the last grade level you completed in school?: bachelor degree  Interpreter Needed?: No  Information entered by :: Lolita Maree MARINER)   Activities of Daily Living     04/20/2024    9:21 AM 02/14/2024   10:03 AM  In your present state of health, do you have any difficulty performing the following activities:  Hearing? 0 0  Vision? 0 0  Difficulty concentrating or making decisions? 0 0  Walking or climbing stairs? 0 0  Dressing or bathing? 0 0  Doing errands, shopping? 0 0  Preparing Food and eating ? N   Using the Toilet? N   In the past six months, have you accidently leaked urine? N   Do you have problems with loss of bowel control? N   Managing your Medications? N   Managing your Finances? N   Housekeeping or managing your Housekeeping? N     Patient Care Team: Amon Aloysius BRAVO, MD as PCP - Diedre Rosalie Kitchens, MD as Consulting Physician (Gastroenterology) [facility]  I have updated your Care Teams any recent Medical Services you may have received from other providers in the past year.     Assessment:   This is a routine wellness examination for Curtis [name].  Hearing/Vision screen Hearing Screening - Comments:: Denies hearing difficulties.  Vision Screening - Comments:: Up to date with routine eye exams with [facility]   Goals Addressed             This Visit's Progress    Patient Stated   On track    Continue eating healthy & walking       Depression Screen     04/27/2024   11:11 AM 02/14/2024   10:05 AM 08/16/2023   11:12 AM 04/27/2023   11:08 AM 04/18/2023   11:22 AM 07/26/2022   10:05 AM 03/08/2022    1:13 PM  PHQ 2/9 Scores  PHQ - 2 Score 0 0 1 0 0 0 0  PHQ- 9 Score 1 3   2  0     Fall Risk     04/20/2024    9:21 AM 02/14/2024   10:05 AM 08/16/2023   11:12 AM 05/25/2023    2:30 PM 04/27/2023   11:06 AM  Fall Risk   Falls in the past year? 0 1 1 1  0  Number falls in past yr: 0 0 0 0 0  Injury with Fall? 0 0 1 1 0  Risk for fall due to : History of fall(s)    No Fall Risks  Follow up Education provided Falls evaluation completed;Education provided Falls evaluation completed;Education provided Falls evaluation completed;Education provided Falls evaluation completed    MEDICARE RISK AT HOME:  Medicare Risk at Home Any stairs in or around the home?: (Patient-Rptd) Yes If so, are there any without handrails?: (Patient-Rptd) Yes Home free of loose throw rugs in walkways, pet beds, electrical cords, etc?: (Patient-Rptd) Yes Adequate lighting in your home to reduce risk of falls?: (Patient-Rptd)  Yes Life alert?: (Patient-Rptd) No Use of a cane, walker or w/c?: (Patient-Rptd) No Grab bars in the bathroom?: (Patient-Rptd) No Shower chair or bench in shower?: (Patient-Rptd) Yes Elevated toilet seat or a handicapped toilet?: (Patient-Rptd) Yes  TIMED UP AND GO:  Was the test performed?   No,audio  Cognitive Function: 6CIT completed        04/27/2024   11:14 AM 04/27/2023   11:16 AM 03/08/2022    1:19 PM  6CIT Screen  What Year? 0 points 0 points 0 points  What month? 0 points 0 points 0 points  What time? 0 points 0 points 0 points  Count back from 20 0 points 0 points 0 points  Months in reverse 0 points 0 points 0 points  Repeat phrase 0 points 0 points 0 points  Total Score 0 points 0 points 0 points    Immunizations Immunization History  Administered Date(s) Administered   Fluad Quad(high Dose 65+) 06/20/2019   Influenza,inj,Quad PF,6+ Mos 05/02/2013, 03/22/2014, 03/28/2015, 05/25/2016, 06/14/2017   Influenza-Unspecified 05/28/2018   PNEUMOCOCCAL CONJUGATE-20 07/23/2021   Pneumococcal Conjugate-13 03/28/2015   Pneumococcal Polysaccharide-23 05/31/2011   Td 03/31/2007   Tdap 06/20/2014, 06/14/2017    Screening Tests Health Maintenance  Topic Date Due   Zoster Vaccines- Shingrix  (1 of 2) Never done   Medicare Annual Wellness (AWV)  04/26/2024   Lung Cancer Screening  05/15/2024   Influenza Vaccine  09/25/2024 (Originally 01/27/2024)   DTaP/Tdap/Td (4 - Td or Tdap) 06/15/2027   Colonoscopy  02/03/2031   Pneumococcal Vaccine: 50+ Years  Completed   Hepatitis C Screening  Completed   Meningococcal B Vaccine  Aged Out   COVID-19 Vaccine  Discontinued    Health Maintenance Items Addressed: Will get shingles vaccines at pharmacy. Lung cancer screening ordered.  Additional Screening:  Vision Screening: Recommended annual ophthalmology exams for early detection of glaucoma and other disorders of the eye. Is the patient up to date with their annual eye exam?  Yes  Who is the provider or what is the name of the office in which the patient attends annual eye exams? [facility]  Dental Screening: Recommended annual dental exams for proper oral hygiene  Community Resource Referral / Chronic Care Management: CRR required this visit?  No    CCM required this visit?  No   Plan:    I have personally reviewed and noted the following in the patient's chart:   Medical and social history Use of alcohol, tobacco or illicit drugs  Current medications and supplements including opioid prescriptions. Patient is not currently taking opioid prescriptions. Functional ability and status Nutritional status Physical activity Advanced directives List of other physicians Hospitalizations, surgeries, and ER visits in previous 12 months Vitals Screenings to include cognitive, depression, and falls Referrals and appointments  In addition, I have reviewed and discussed with patient certain preventive protocols, quality metrics, and best practice recommendations. A written personalized care plan for preventive services as well as general preventive health recommendations were provided to patient.   Lolita Libra, CMA   04/27/2024   After Visit Summary: (MyChart) Due to this being a telephonic visit, the after visit summary with patients personalized plan was offered to patient via MyChart   Notes: Nothing significant to report at this time.

## 2024-05-19 ENCOUNTER — Other Ambulatory Visit: Payer: Self-pay | Admitting: Internal Medicine

## 2024-06-11 NOTE — Progress Notes (Unsigned)
 [name] [address] [place] [address] Phone: [phone] Subjective:   Curtis [name] am a scribe for Dr. Claudene.   I'm seeing this patient by the request  of:  Curtis Aloysius BRAVO, MD  CC: Right shoulder pain follow-up  YEP:Dlagzrupcz   04/12/2024 Some arthritic changes noted, discussed icing regimen and home exercises, discussed which activities to do and which ones to avoid.  Increase activity slowly.  Discussed icing regimen.  Follow-up with me again in 6 to 8 weeks     Update 06/12/2024 Curtis [name] is a 68 y.o. male coming in with complaint of R shoulder pain. Patient states that it is the same as it always is. Using Voltaren gel on areas of pain. Doing exercises about every day. Going to the gym two days a week.       Past Medical History:  Diagnosis Date   COPD (chronic obstructive pulmonary disease) (HCC)    Depression    Multiple rib fractures 10/2016   R side after fall   Neck pain, chronic    Pneumothorax 10/2016   R side, after fall   Past Surgical History:  Procedure Laterality Date   CHEST TUBE INSERTION Right 10/2016   HERNIA REPAIR  2009   right   KNEE SURGERY Right 1990s   arthroscopy   Social History   Socioeconomic History   Marital status: Married    Spouse name: Curtis [name]   Number of children: 4   Years of education: Not on file   Highest education level: Bachelor's degree (e.g., BA, AB, BS)  Occupational History   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    Employer: [employer]  Tobacco Use   Smoking status: Former    Current packs/day: 0.00    Average packs/day: 1.3 packs/day for 40.0 years (52.0 ttl pk-yrs)    Types: Cigarettes, E-cigarettes    Start date: 1976    Quit date: 2016    Years since quitting: 9.9   Smokeless tobacco: Never   Tobacco comments:    quit cigarrets, vapes since ~ 05/2017  Vaping Use   Vaping status: Every Day  Substance and Sexual Activity   Alcohol use: Yes    Comment:  socially, monthly or less   Drug use: No   Sexual activity: Not on file  Other Topics Concern   Not on file  Social History Narrative   Lives w/ wife   4 children   --1 son h/o of substance abuse Curtis [name]) , he lives w/ them   --1 son is a teacher, early years/pre    --Daughter Curtis Fireman NP for ID, 2 children   --Daughter Curtis [name], 1 child             Social Drivers of Health   Tobacco Use: Medium Risk (04/27/2024)   Patient History    Smoking Tobacco Use: Former    Smokeless Tobacco Use: Never    Passive Exposure: Not on Actuary Strain: Low Risk (04/27/2024)   Overall Financial Resource Strain (CARDIA)    Difficulty of Paying Living Expenses: Not very hard  Food Insecurity: No Food Insecurity (04/27/2024)   Epic    Worried About Programme Researcher, Broadcasting/film/video in the Last Year: Never true    Ran Out of Food in the Last Year: Never true  Transportation Needs: No Transportation Needs (04/27/2024)   Epic    Lack of Transportation (Medical): No    Lack of Transportation (Non-Medical): No  Physical Activity: Sufficiently Active (04/27/2024)   Exercise Vital Sign    Days of Exercise per Week: 7 days    Minutes of Exercise per Session: 60 min  Stress: No Stress Concern Present (04/27/2024)   [employer] of Occupational Health - Occupational Stress Questionnaire    Feeling of Stress: Not at all  Social Connections: Moderately Integrated (04/27/2024)   Social Connection and Isolation Panel    Frequency of Communication with Friends and Family: Three times a week    Frequency of Social Gatherings with Friends and Family: Three times a week    Attends Religious Services: Never    Active Member of Clubs or Organizations: Yes    Attends Banker Meetings: More than 4 times per year    Marital Status: Married  Depression (PHQ2-9): Low Risk (04/27/2024)   Depression (PHQ2-9)    PHQ-2 Score: 1  Alcohol Screen: Low Risk (04/27/2024)   Alcohol Screen    Last Alcohol  Screening Score (AUDIT): 1  Housing: Low Risk (04/27/2024)   Epic    Unable to Pay for Housing in the Last Year: No    Number of Times Moved in the Last Year: 0    Homeless in the Last Year: No  Utilities: Not At Risk (04/27/2024)   Epic    Threatened with loss of utilities: No  Health Literacy: Adequate Health Literacy (04/27/2024)   B1300 Health Literacy    Frequency of need for help with medical instructions: Never   Allergies[1] Family History  Problem Relation Age of Onset   Cancer Mother        skin   Breast cancer Mother 77   COPD Father    Diabetes Father    Stroke Father 52   Alcohol abuse Son    Colon cancer Neg Hx    Prostate cancer Neg Hx    CAD Neg Hx        MGF?    Current Outpatient Medications (Analgesics):    aspirin EC 81 MG tablet, Take 81 mg by mouth daily. Swallow whole.  Current Outpatient Medications (Other):    Acetylcysteine (NAC PO), Take 1 tablet by mouth daily.   Ascorbic Acid 500 MG CAPS, Take 1 capsule by mouth daily.   ASHWAGANDHA PO, Take 1 tablet by mouth daily.   b complex vitamins capsule, Take 1 capsule by mouth daily.   BLACK CURRANT SEED OIL PO, Take 1 tablet by mouth daily.   Calcium Carbonate (CALCIUM 600 PO), Take by mouth.   cholecalciferol (VITAMIN D3) 25 MCG (1000 UNIT) tablet, Take 1,000 Units by mouth daily.   GARLIC PO, Take 1 capsule by mouth daily.   Multiple Vitamin (MULTIVITAMIN WITH MINERALS) TABS tablet, Take 1 tablet by mouth daily.   OREGANO PO, Take 1 capsule by mouth as needed (for colds).   OVER THE COUNTER MEDICATION, Take 1 tablet by mouth daily. Pumpkin seed oil   venlafaxine  XR (EFFEXOR -XR) 37.5 MG 24 hr capsule, TAKE 1 CAPSULE(37.5 MG) BY MOUTH DAILY WITH BREAKFAST   Reviewed prior external information including notes and imaging from  primary care provider As well as notes that were available from care everywhere and other healthcare systems.  Past medical history, social, surgical and family history  all reviewed in electronic medical record.  No pertanent information unless stated regarding to the chief complaint.   Review of Systems:  No headache, visual changes, nausea, vomiting, diarrhea, constipation, dizziness, abdominal pain, skin rash, fevers, chills, night sweats, weight loss,  swollen lymph nodes, body aches, joint swelling, chest pain, shortness of breath, mood changes. POSITIVE muscle aches  Objective  Blood pressure 122/60, pulse 79, height 5' 9.5 (1.765 m), weight 166 lb 3.2 oz (75.4 kg), SpO2 95%.   General: No apparent distress alert and oriented x3 mood and affect normal, dressed appropriately.  HEENT: Pupils equal, extraocular movements intact  Respiratory: Patient's speak in full sentences and does not appear short of breath  Cardiovascular: No lower extremity edema, non tender, no erythema  Right shoulder exam shows mild impingement noted.  Mild positive crossover noted.  Rotator cuff strength appears to be intact.   Limited muscular skeletal ultrasound was performed and interpreted by [name] HUSSAR, M  Mild hypoechoic changes noted of the bicep tendon and the anterior labrum appears to have some potential tearing noted.  Hypoechoic changes in the glenohumeral joint.  Moderate narrowing of the acromioclavicular joint noted as well. Impression: Fairly stable from previous exam.   Impression and Recommendations:    The above documentation has been reviewed and is accurate and complete Naylene Foell M Kadeisha Betsch, DO        [1] No Known Allergies

## 2024-06-12 ENCOUNTER — Ambulatory Visit: Admitting: Family Medicine

## 2024-06-12 ENCOUNTER — Other Ambulatory Visit: Payer: Self-pay

## 2024-06-12 ENCOUNTER — Encounter: Payer: Self-pay | Admitting: Family Medicine

## 2024-06-12 VITALS — BP 122/60 | HR 79 | Ht 69.5 in | Wt 166.2 lb

## 2024-06-12 DIAGNOSIS — M19011 Primary osteoarthritis, right shoulder: Secondary | ICD-10-CM

## 2024-06-12 DIAGNOSIS — M25511 Pain in right shoulder: Secondary | ICD-10-CM

## 2024-06-12 DIAGNOSIS — M5030 Other cervical disc degeneration, unspecified cervical region: Secondary | ICD-10-CM

## 2024-06-12 NOTE — Assessment & Plan Note (Signed)
 Discussed icing regimen and home exercises.  Follow-up again in 6 to 8 weeks.

## 2024-06-12 NOTE — Assessment & Plan Note (Signed)
 Continues to be significantly arthritic.  Continues some limitation in some of the range of motion.  Not stopping and no from any activity specifically.  We discussed still ergonomics that I think will be beneficial.  Increase activity slightly.  Follow-up with me again in 2 to 3 months otherwise.

## 2024-06-12 NOTE — Patient Instructions (Signed)
 Good to see you. Thanks for making me laugh. Use that bag of peas.  See me again in 3 months.

## 2024-08-01 ENCOUNTER — Other Ambulatory Visit: Payer: Self-pay | Admitting: *Deleted

## 2024-08-01 DIAGNOSIS — Z122 Encounter for screening for malignant neoplasm of respiratory organs: Secondary | ICD-10-CM

## 2024-08-01 DIAGNOSIS — Z87891 Personal history of nicotine dependence: Secondary | ICD-10-CM

## 2024-08-21 ENCOUNTER — Encounter: Admitting: Internal Medicine

## 2024-08-23 ENCOUNTER — Ambulatory Visit: Admitting: Cardiology

## 2024-09-11 ENCOUNTER — Ambulatory Visit: Admitting: Family Medicine

## 2025-05-01 ENCOUNTER — Ambulatory Visit
